# Patient Record
Sex: Female | Born: 1975 | Race: White | Hispanic: No | Marital: Married | Smoking: Current every day smoker
Health system: Southern US, Community
[De-identification: ages and names within clinical notes are randomized; demographics above are authoritative.]

## PROBLEM LIST (undated history)

## (undated) DIAGNOSIS — F319 Bipolar disorder, unspecified: Secondary | ICD-10-CM

## (undated) DIAGNOSIS — R0600 Dyspnea, unspecified: Secondary | ICD-10-CM

## (undated) DIAGNOSIS — J45909 Unspecified asthma, uncomplicated: Secondary | ICD-10-CM

## (undated) DIAGNOSIS — K219 Gastro-esophageal reflux disease without esophagitis: Secondary | ICD-10-CM

## (undated) DIAGNOSIS — R519 Headache, unspecified: Secondary | ICD-10-CM

## (undated) DIAGNOSIS — J449 Chronic obstructive pulmonary disease, unspecified: Secondary | ICD-10-CM

## (undated) DIAGNOSIS — R569 Unspecified convulsions: Secondary | ICD-10-CM

## (undated) DIAGNOSIS — R51 Headache: Secondary | ICD-10-CM

## (undated) DIAGNOSIS — F419 Anxiety disorder, unspecified: Secondary | ICD-10-CM

## (undated) DIAGNOSIS — F329 Major depressive disorder, single episode, unspecified: Secondary | ICD-10-CM

## (undated) DIAGNOSIS — F32A Depression, unspecified: Secondary | ICD-10-CM

## (undated) HISTORY — PX: APPENDECTOMY: SHX54

## (undated) HISTORY — PX: TUBAL LIGATION: SHX77

---

## 2006-05-16 DIAGNOSIS — R569 Unspecified convulsions: Secondary | ICD-10-CM

## 2006-05-16 HISTORY — DX: Unspecified convulsions: R56.9

## 2010-12-06 ENCOUNTER — Emergency Department: Payer: Self-pay | Admitting: Emergency Medicine

## 2015-08-30 ENCOUNTER — Emergency Department: Payer: Medicaid Other

## 2015-08-30 ENCOUNTER — Encounter: Payer: Self-pay | Admitting: Emergency Medicine

## 2015-08-30 ENCOUNTER — Emergency Department
Admission: EM | Admit: 2015-08-30 | Discharge: 2015-08-30 | Disposition: A | Discharge status: Home / Self Care | Payer: Medicaid Other | Attending: Emergency Medicine | Admitting: Emergency Medicine

## 2015-08-30 DIAGNOSIS — F17210 Nicotine dependence, cigarettes, uncomplicated: Secondary | ICD-10-CM | POA: Diagnosis not present

## 2015-08-30 DIAGNOSIS — F329 Major depressive disorder, single episode, unspecified: Secondary | ICD-10-CM | POA: Diagnosis not present

## 2015-08-30 DIAGNOSIS — R1011 Right upper quadrant pain: Secondary | ICD-10-CM | POA: Diagnosis not present

## 2015-08-30 DIAGNOSIS — R109 Unspecified abdominal pain: Secondary | ICD-10-CM | POA: Diagnosis present

## 2015-08-30 HISTORY — DX: Major depressive disorder, single episode, unspecified: F32.9

## 2015-08-30 HISTORY — DX: Depression, unspecified: F32.A

## 2015-08-30 HISTORY — DX: Anxiety disorder, unspecified: F41.9

## 2015-08-30 LAB — COMPREHENSIVE METABOLIC PANEL
ALK PHOS: 123 U/L (ref 38–126)
ALT: 14 U/L (ref 14–54)
ANION GAP: 6 (ref 5–15)
AST: 16 U/L (ref 15–41)
Albumin: 4.2 g/dL (ref 3.5–5.0)
BILIRUBIN TOTAL: 0.6 mg/dL (ref 0.3–1.2)
BUN: 11 mg/dL (ref 6–20)
CALCIUM: 8.8 mg/dL — AB (ref 8.9–10.3)
CO2: 23 mmol/L (ref 22–32)
CREATININE: 0.77 mg/dL (ref 0.44–1.00)
Chloride: 105 mmol/L (ref 101–111)
GFR calc non Af Amer: >60 mL/min (ref >60)
Glucose, Bld: 88 mg/dL (ref 65–99)
Potassium: 3.4 mmol/L — ABNORMAL LOW (ref 3.5–5.1)
Sodium: 134 mmol/L — ABNORMAL LOW (ref 135–145)
TOTAL PROTEIN: 7.8 g/dL (ref 6.5–8.1)

## 2015-08-30 LAB — URINALYSIS COMPLETE WITH MICROSCOPIC (ARMC ONLY)
BILIRUBIN URINE: NEGATIVE
Bacteria, UA: NONE SEEN
GLUCOSE, UA: NEGATIVE mg/dL
Hgb urine dipstick: NEGATIVE
KETONES UR: NEGATIVE mg/dL
NITRITE: NEGATIVE
PH: 5 (ref 5.0–8.0)
Protein, ur: NEGATIVE mg/dL
SPECIFIC GRAVITY, URINE: 1.018 (ref 1.005–1.030)

## 2015-08-30 LAB — CBC
HCT: 40.5 % (ref 35.0–47.0)
Hemoglobin: 13.3 g/dL (ref 12.0–16.0)
MCH: 26 pg (ref 26.0–34.0)
MCHC: 33 g/dL (ref 32.0–36.0)
MCV: 78.8 fL — AB (ref 80.0–100.0)
PLATELETS: 226 10*3/uL (ref 150–440)
RBC: 5.13 MIL/uL (ref 3.80–5.20)
RDW: 14 % (ref 11.5–14.5)
WBC: 9.7 10*3/uL (ref 3.6–11.0)

## 2015-08-30 LAB — LIPASE, BLOOD: Lipase: 18 U/L (ref 11–51)

## 2015-08-30 LAB — POCT PREGNANCY, URINE: Preg Test, Ur: NEGATIVE

## 2015-08-30 MED ORDER — KETOROLAC TROMETHAMINE 30 MG/ML IJ SOLN
30.0000 mg | Freq: Once | INTRAMUSCULAR | Status: AC
  Administered 2015-08-30: 30 mg via INTRAVENOUS

## 2015-08-30 MED ORDER — SODIUM CHLORIDE 0.9 % IV BOLUS (SEPSIS)
1000.0000 mL | Freq: Once | INTRAVENOUS | Status: AC
  Administered 2015-08-30: 1000 mL via INTRAVENOUS

## 2015-08-30 MED ORDER — ONDANSETRON HCL 4 MG/2ML IJ SOLN
INTRAMUSCULAR | Status: AC
  Administered 2015-08-30: 4 mg via INTRAVENOUS
  Filled 2015-08-30: qty 2

## 2015-08-30 MED ORDER — OXYCODONE-ACETAMINOPHEN 5-325 MG PO TABS: 1.0000 | ORAL_TABLET | Freq: Four times a day (QID) | ORAL | Status: DC | PRN

## 2015-08-30 MED ORDER — ONDANSETRON HCL 4 MG/2ML IJ SOLN
4.0000 mg | Freq: Once | INTRAMUSCULAR | Status: AC
  Administered 2015-08-30: 4 mg via INTRAVENOUS

## 2015-08-30 MED ORDER — KETOROLAC TROMETHAMINE 30 MG/ML IJ SOLN
INTRAMUSCULAR | Status: AC
  Filled 2015-08-30: qty 1

## 2015-08-30 MED ORDER — HYDROMORPHONE HCL 1 MG/ML IJ SOLN
1.0000 mg | Freq: Once | INTRAMUSCULAR | Status: AC
Start: 2015-08-30 — End: 2015-08-30
  Administered 2015-08-30: 1 mg via INTRAVENOUS

## 2015-08-30 MED ORDER — MORPHINE SULFATE (PF) 4 MG/ML IV SOLN
4.0000 mg | Freq: Once | INTRAVENOUS | Status: AC
  Administered 2015-08-30: 4 mg via INTRAVENOUS
  Filled 2015-08-30: qty 1

## 2015-08-30 MED ORDER — HYDROMORPHONE HCL 1 MG/ML IJ SOLN
INTRAMUSCULAR | Status: AC
Start: 2015-08-30 — End: 2015-08-30
  Administered 2015-08-30: 1 mg via INTRAVENOUS
  Filled 2015-08-30: qty 1

## 2015-08-30 NOTE — Discharge Instructions (Signed)
You have been seen in the emergency department today for abdominal pain. As we discussed your workup as shown normal results. If your pain worsens, you developed a fever, or any other symptom personally concerning to yourself please return to the emergency department for further evaluation, otherwise please follow-up with your primary care physician in one to 2 days for recheck/reevaluation.   Flank Pain Flank pain refers to pain that is located on the side of the body between the upper abdomen and the back. The pain may occur over a short period of time (acute) or may be long-term or reoccurring (chronic). It may be mild or severe. Flank pain can be caused by many things. CAUSES  Some of the more common causes of flank pain include:  Muscle strains.   Muscle spasms.   A disease of your spine (vertebral disk disease).   A lung infection (pneumonia).   Fluid around your lungs (pulmonary edema).   A kidney infection.   Kidney stones.   A very painful skin rash caused by the chickenpox virus (shingles).   Gallbladder disease.  HOME CARE INSTRUCTIONS  Home care will depend on the cause of your pain. In general,  Rest as directed by your caregiver.  Drink enough fluids to keep your urine clear or pale yellow.  Only take over-the-counter or prescription medicines as directed by your caregiver. Some medicines may help relieve the pain.  Tell your caregiver about any changes in your pain.  Follow up with your caregiver as directed. SEEK IMMEDIATE MEDICAL CARE IF:   Your pain is not controlled with medicine.   You have new or worsening symptoms.  Your pain increases.   You have abdominal pain.   You have shortness of breath.   You have persistent nausea or vomiting.   You have swelling in your abdomen.   You feel faint or pass out.   You have blood in your urine.  You have a fever or persistent symptoms for more than 2-3 days.  You have a fever and  your symptoms suddenly get worse. MAKE SURE YOU:   Understand these instructions.  Will watch your condition.  Will get help right away if you are not doing well or get worse.   This information is not intended to replace advice given to you by your health care provider. Make sure you discuss any questions you have with your health care provider.   Document Released: 06/23/2005 Document Revised: 01/25/2012 Document Reviewed: 12/15/2011 Elsevier Interactive Patient Education [employer]

## 2015-08-30 NOTE — ED Notes (Signed)
Pt up to bathroom.

## 2015-08-30 NOTE — ED Notes (Signed)
Pt alert and oriented X4, active, cooperative, pt in NAD. RR even and unlabored, color WNL.  Pt informed to return if any life threatening symptoms occur.   

## 2015-08-30 NOTE — ED Notes (Signed)
Started last night, worse tonight. No pain with urination. No hx of kidney stones

## 2015-08-30 NOTE — ED Provider Notes (Signed)
[facility] Emergency Department Provider Note  Time seen: 9:08 PM  I have reviewed the triage vital signs and the nursing notes.   HISTORY  Chief Complaint Flank Pain    HPI Carrie Marshall is a 40 y.o. female with a past medical history of anxiety/depression, presents to the emergency department with right sided abdominal pain. According to the patient since last night she has been experiencing severe 10/10 sharp pain to her right flank. Denies any dysuria, hematuria, history of kidney stones. Denies any worsening of pain with food intake. States nausea but denies vomiting. Denies diarrhea or constipation.     Past Medical History  Diagnosis Date  . Depression   . Anxiety     There are no active problems to display for this patient.   Past Surgical History  Procedure Laterality Date  . Appendectomy      No current outpatient prescriptions on file.  Allergies Review of patient's allergies indicates no known allergies.  History reviewed. No pertinent family history.  Social History Social History  Substance Use Topics  . Smoking status: Current Every Day Smoker -- 1.00 packs/day    Types: Cigarettes  . Smokeless tobacco: None  . Alcohol Use: No    Review of Systems Constitutional: Negative for fever Cardiovascular: Negative for chest pain. Respiratory: Negative for shortness of breath. Gastrointestinal: Right flank pain. Positive for nausea. Negative for vomiting. Genitourinary: Negative for dysuria. negative for hematuria  Musculoskeletal: Negative for back pain. Skin: Negative for rash. Neurological: Negative for headache 10-point ROS otherwise negative.  ____________________________________________   PHYSICAL EXAM:  VITAL SIGNS: ED Triage Vitals  Enc Vitals Group     BP 08/30/15 2036 108/77 mmHg     Pulse Rate 08/30/15 2036 72     Resp 08/30/15 2036 16     Temp 08/30/15 2036 98.4 F (36.9 C)     Temp Source 08/30/15  2036 Oral     SpO2 08/30/15 2036 96 %     Weight 08/30/15 2036 177 lb (80.287 kg)     Height 08/30/15 2036 5\' 7"  (1.702 m)     Head Cir --      Peak Flow --      Pain Score 08/30/15 2037 10     Pain Loc --      Pain Edu? --      Excl. in GC? --     Constitutional: Alert and oriented. Well appearing and in no distress. Eyes: Normal exam ENT   Head: Normocephalic and atraumatic.   Mouth/Throat: Mucous membranes are moist. Cardiovascular: Normal rate, regular rhythm.  Respiratory: Normal respiratory effort without tachypnea nor retractions. Breath sounds are clear  Gastrointestinal: soft, moderate tenderness to palpation in the right mid abdomen and right upper quadrant. No right lower quadrant tenderness to palpation. No rebound or guarding. No distention. Musculoskeletal: Nontender with normal range of motion in all extremities.  Neurologic:  Normal speech and language. No gross focal neurologic deficits  Skin:  Skin is warm, dry and intact.  Psychiatric: Mood and affect are normal. Speech and behavior are normal.  ____________________________________________     RADIOLOGY  CT negative Ultrasound negative  ____________________________________________    INITIAL IMPRESSION / ASSESSMENT AND PLAN / ED COURSE  Pertinent labs & imaging results that were available during my care of the patient were reviewed by me and considered in my medical decision making (see chart for details).  Patient presents with right flank pain since last night. Patient does have  moderate tenderness to palpation in the right mid abdomen and right upper quadrant. We'll proceed with labs, right upper quadrant ultrasound, treat pain and nausea, and IV hydrate.  Labs are within normal limits. CT no center negative. It is not clear at this time what is causing the patient's discomfort. She states it is improved but still present. I discussed options with the patient, she would prefer to be discharged  home, will provide a short course of pain medication and the patient is to return to the emergency department if the pain worsens, otherwise she'll follow-up with her doctor.  ____________________________________________   FINAL CLINICAL IMPRESSION(S) / ED DIAGNOSES  Right flank pain   Minna Antis, MD 08/30/15 2310

## 2015-10-10 ENCOUNTER — Encounter: Payer: Self-pay | Admitting: Emergency Medicine

## 2015-10-10 ENCOUNTER — Emergency Department: Payer: Medicaid Other

## 2015-10-10 DIAGNOSIS — F329 Major depressive disorder, single episode, unspecified: Secondary | ICD-10-CM | POA: Insufficient documentation

## 2015-10-10 DIAGNOSIS — B349 Viral infection, unspecified: Secondary | ICD-10-CM | POA: Diagnosis not present

## 2015-10-10 DIAGNOSIS — F17210 Nicotine dependence, cigarettes, uncomplicated: Secondary | ICD-10-CM | POA: Insufficient documentation

## 2015-10-10 DIAGNOSIS — R0789 Other chest pain: Secondary | ICD-10-CM | POA: Insufficient documentation

## 2015-10-10 LAB — BASIC METABOLIC PANEL
ANION GAP: 8 (ref 5–15)
BUN: 15 mg/dL (ref 6–20)
CO2: 21 mmol/L — AB (ref 22–32)
CREATININE: 0.78 mg/dL (ref 0.44–1.00)
Calcium: 9.4 mg/dL (ref 8.9–10.3)
Chloride: 108 mmol/L (ref 101–111)
GFR calc non Af Amer: >60 mL/min (ref >60)
Glucose, Bld: 92 mg/dL (ref 65–99)
POTASSIUM: 3.8 mmol/L (ref 3.5–5.1)
Sodium: 137 mmol/L (ref 135–145)

## 2015-10-10 LAB — CBC
HEMATOCRIT: 40.9 % (ref 35.0–47.0)
HEMOGLOBIN: 13.7 g/dL (ref 12.0–16.0)
MCH: 26.1 pg (ref 26.0–34.0)
MCHC: 33.4 g/dL (ref 32.0–36.0)
MCV: 78 fL — AB (ref 80.0–100.0)
PLATELETS: 247 10*3/uL (ref 150–440)
RBC: 5.24 MIL/uL — ABNORMAL HIGH (ref 3.80–5.20)
RDW: 13.4 % (ref 11.5–14.5)
WBC: 9.5 10*3/uL (ref 3.6–11.0)

## 2015-10-10 LAB — TROPONIN I: Troponin I: <0.03 ng/mL (ref <0.031)

## 2015-10-10 LAB — POCT PREGNANCY, URINE: Preg Test, Ur: NEGATIVE

## 2015-10-10 NOTE — ED Notes (Signed)
Cough x 1 week - now states cp and sob. vss

## 2015-10-11 ENCOUNTER — Emergency Department
Admission: EM | Admit: 2015-10-11 | Discharge: 2015-10-11 | Disposition: A | Discharge status: Home / Self Care | Payer: Medicaid Other | Attending: Emergency Medicine | Admitting: Emergency Medicine

## 2015-10-11 DIAGNOSIS — R0789 Other chest pain: Secondary | ICD-10-CM

## 2015-10-11 DIAGNOSIS — B349 Viral infection, unspecified: Secondary | ICD-10-CM

## 2015-10-11 MED ORDER — BENZONATATE 100 MG PO CAPS: 100.0000 mg | ORAL_CAPSULE | Freq: Three times a day (TID) | ORAL | Status: DC | PRN

## 2015-10-11 MED ORDER — KETOROLAC TROMETHAMINE 60 MG/2ML IM SOLN: 15.0000 mg | Freq: Once | INTRAMUSCULAR | Status: DC

## 2015-10-11 MED ORDER — KETOROLAC TROMETHAMINE 30 MG/ML IJ SOLN
15.0000 mg | Freq: Once | INTRAMUSCULAR | Status: DC
  Administered 2015-10-11: 15 mg via INTRAVENOUS
  Filled 2015-10-11: qty 1

## 2015-10-11 NOTE — ED Notes (Signed)
Pt up to desk to check on wait time; understands waiting on treatment room; was going to leave but at this time pt has been convinced to stay for treatment room;

## 2015-10-11 NOTE — Discharge Instructions (Signed)
You have been seen in the Emergency Department (ED) today for a likely viral illness.  Please drink plenty of clear fluids (water, Gatorade, chicken broth, etc).  You may use Tylenol and/or Motrin according to label instructions.  You can alternate between the two without any side effects.   Please follow up with your doctor as listed above.  Call your doctor or return to the Emergency Department (ED) if you are unable to tolerate fluids due to vomiting, have worsening trouble breathing, become extremely tired or difficult to awaken, or if you develop any other symptoms that concern you.   Viral Infections A viral infection can be caused by different types of viruses.Most viral infections are not serious and resolve on their own. However, some infections may cause severe symptoms and may lead to further complications. SYMPTOMS Viruses can frequently cause:  Minor sore throat.  Aches and pains.  Headaches.  Runny nose.  Different types of rashes.  Watery eyes.  Tiredness.  Cough.  Loss of appetite.  Gastrointestinal infections, resulting in nausea, vomiting, and diarrhea. These symptoms do not respond to antibiotics because the infection is not caused by bacteria. However, you might catch a bacterial infection following the viral infection. This is sometimes called a "superinfection." Symptoms of such a bacterial infection may include:  Worsening sore throat with pus and difficulty swallowing.  Swollen neck glands.  Chills and a high or persistent fever.  Severe headache.  Tenderness over the sinuses.  Persistent overall ill feeling (malaise), muscle aches, and tiredness (fatigue).  Persistent cough.  Yellow, green, or brown mucus production with coughing. HOME CARE INSTRUCTIONS   Only take over-the-counter or prescription medicines for pain, discomfort, diarrhea, or fever as directed by your caregiver.  Drink enough water and fluids to keep your urine clear or  pale yellow. Sports drinks can provide valuable electrolytes, sugars, and hydration.  Get plenty of rest and maintain proper nutrition. Soups and broths with crackers or rice are fine. SEEK IMMEDIATE MEDICAL CARE IF:   You have severe headaches, shortness of breath, chest pain, neck pain, or an unusual rash.  You have uncontrolled vomiting, diarrhea, or you are unable to keep down fluids.  You or your child has an oral temperature above 102 F (38.9 C), not controlled by medicine.  Your baby is older than 3 months with a rectal temperature of 102 F (38.9 C) or higher.  Your baby is 483 months old or younger with a rectal temperature of 100.4 F (38 C) or higher. MAKE SURE YOU:   Understand these instructions.  Will watch your condition.  Will get help right away if you are not doing well or get worse.   This information is not intended to replace advice given to you by your health care provider. Make sure you discuss any questions you have with your health care provider.   Document Released: 02/09/2005 Document Revised: 07/25/2011 Document Reviewed: 10/08/2014 Elsevier Interactive Patient Education 2016 Elsevier Inc.  Chest Wall Pain Chest wall pain is pain in or around the bones and muscles of your chest. Sometimes, an injury causes this pain. Sometimes, the cause may not be known. This pain may take several weeks or longer to get better. HOME CARE INSTRUCTIONS  Pay attention to any changes in your symptoms. Take these actions to help with your pain:   Rest as told by your health care provider.   Avoid activities that cause pain. These include any activities that use your chest muscles or your  abdominal and side muscles to lift heavy items.   If directed, apply ice to the painful area:  Put ice in a plastic bag.  Place a towel between your skin and the bag.  Leave the ice on for 20 minutes, 2-3 times per day.  Take over-the-counter and prescription medicines only as  told by your health care provider.  Do not use tobacco products, including cigarettes, chewing tobacco, and e-cigarettes. If you need help quitting, ask your health care provider.  Keep all follow-up visits as told by your health care provider. This is important. SEEK MEDICAL CARE IF:  You have a fever.  Your chest pain becomes worse.  You have new symptoms. SEEK IMMEDIATE MEDICAL CARE IF:  You have nausea or vomiting.  You feel sweaty or light-headed.  You have a cough with phlegm (sputum) or you cough up blood.  You develop shortness of breath.   This information is not intended to replace advice given to you by your health care provider. Make sure you discuss any questions you have with your health care provider.   Document Released: 05/02/2005 Document Revised: 01/21/2015 Document Reviewed: 07/28/2014 Elsevier Interactive Patient Education [employer]

## 2015-10-11 NOTE — ED Provider Notes (Signed)
[facility] Emergency Department Provider Note  ____________________________________________  Time seen: Approximately 2:28 AM  I have reviewed the triage vital signs and the nursing notes.   HISTORY  Chief Complaint Cough and Chest Pain    HPI Carrie Marshall is a 40 y.o. female with past medical history that includes depression, anxiety, and tobacco use who presents for approximately 1 week of gradual onset upper respiratory infectious symptoms that include a mild, slightly productive cough, nasal congestion, runny nose, low-grade fevers.  She states that as of a few hours ago she developed chest wall tenderness all throughout her anterior chest after she has been coughing severely for about 24 hours, and she describes the chest wall tenderness as severe, sharp, and worse with movement and deep breaths.  She denies chills, nausea/vomiting, abdominal pain, diarrhea, dysuria.  She describes her symptoms as severe overall.   Past Medical History  Diagnosis Date  . Depression   . Anxiety     There are no active problems to display for this patient.   Past Surgical History  Procedure Laterality Date  . Appendectomy      Current Outpatient Rx  Name  Route  Sig  Dispense  Refill  . benzonatate (TESSALON PERLES) 100 MG capsule   Oral   Take 1 capsule (100 mg total) by mouth 3 (three) times daily as needed for cough.   30 capsule   0   . oxyCODONE-acetaminophen (ROXICET) 5-325 MG tablet   Oral   Take 1 tablet by mouth every 6 (six) hours as needed.   12 tablet   0     Allergies Review of patient's allergies indicates no known allergies.  History reviewed. No pertinent family history.  Social History Social History  Substance Use Topics  . Smoking status: Current Every Day Smoker -- 1.00 packs/day    Types: Cigarettes  . Smokeless tobacco: None  . Alcohol Use: No    Review of Systems Constitutional: Subjective fever Eyes: No visual  changes. ENT: No sore throat. Cardiovascular: Chest wall tenderness Respiratory: Mild SOB and cough x 1 week Gastrointestinal: No abdominal pain.  No nausea, no vomiting.  No diarrhea.  No constipation. Genitourinary: Negative for dysuria. Musculoskeletal: Negative for back pain. Skin: Negative for rash. Neurological: Negative for headaches, focal weakness or numbness.  10-point ROS otherwise negative.  ____________________________________________   PHYSICAL EXAM:  VITAL SIGNS: ED Triage Vitals  Enc Vitals Group     BP 10/10/15 2150 123/110 mmHg     Pulse Rate 10/10/15 2150 78     Resp 10/10/15 2150 18     Temp 10/10/15 2150 98.1 F (36.7 C)     Temp src --      SpO2 10/10/15 2150 99 %     Weight 10/10/15 2150 182 lb (82.555 kg)     Height 10/10/15 2150 5\' 7"  (1.702 m)     Head Cir --      Peak Flow --      Pain Score 10/10/15 2151 10     Pain Loc --      Pain Edu? --      Excl. in GC? --     Constitutional: Alert and oriented. Generally well appearing but appears uncomfortable Eyes: Conjunctivae are normal. PERRL. EOMI. Head: Atraumatic. Nose: No congestion/rhinnorhea. Mouth/Throat: Mucous membranes are moist.  Oropharynx non-erythematous. Neck: No stridor.  No meningeal signs.   Cardiovascular: Normal rate, regular rhythm. Good peripheral circulation. Grossly normal heart sounds.  Highly  reproducible chest wall tenderness to palpation all throughout the chest wall. Respiratory: Normal respiratory effort.  No retractions. Lungs CTAB. Gastrointestinal: Soft and nontender. No distention.  Musculoskeletal: No lower extremity tenderness nor edema. No gross deformities of extremities. Neurologic:  Normal speech and language. No gross focal neurologic deficits are appreciated.  Skin:  Skin is warm, dry and intact. No rash noted. Psychiatric: Mood and affect are normal. Speech and behavior are normal.  ____________________________________________   LABS (all labs  ordered are listed, but only abnormal results are displayed)  Labs Reviewed  BASIC METABOLIC PANEL - Abnormal; Notable for the following:    CO2 21 (*)    All other components within normal limits  CBC - Abnormal; Notable for the following:    RBC 5.24 (*)    MCV 78.0 (*)    All other components within normal limits  TROPONIN I  POC URINE PREG, ED  POCT PREGNANCY, URINE   ____________________________________________  EKG  ED ECG REPORT I, Mervil Wacker, the attending physician, personally viewed and interpreted this ECG.  Date: 10/11/2015 EKG Time: 22:03 Rate: 78 Rhythm: normal sinus rhythm QRS Axis: normal Intervals: normal ST/T Wave abnormalities: Non-specific ST segment / T-wave changes, but no evidence of acute ischemia. Conduction Disturbances: none Narrative Interpretation: unremarkable  ____________________________________________  RADIOLOGY   Dg Chest 2 View  10/10/2015  CLINICAL DATA:  Acute onset of cough, generalized chest pain and shortness of breath. Initial encounter. EXAM: CHEST  2 VIEW COMPARISON:  None. FINDINGS: The lungs are well-aerated and clear. There is no evidence of focal opacification, pleural effusion or pneumothorax. The heart is normal in size; the mediastinal contour is within normal limits. No acute osseous abnormalities are seen. IMPRESSION: No acute cardiopulmonary process seen. Electronically Signed   By: Roanna Raider M.D.   On: 10/10/2015 23:11    ____________________________________________   PROCEDURES  Procedure(s) performed: None  Critical Care performed: No ____________________________________________   INITIAL IMPRESSION / ASSESSMENT AND PLAN / ED COURSE  Pertinent labs & imaging results that were available during my care of the patient were reviewed by me and considered in my medical decision making (see chart for details).  HEART score 1 (tobacco use), PERC negative.  I suspect she has musculoskeletal chest wall  tenderness due to her frequent cough.  Symptoms are most consistent with a viral syndrome.  Prescription as listed below, Toradol 15 mg IM prior to discharge.  She is comfortable with the plan for outpatient follow-up.   ____________________________________________  FINAL CLINICAL IMPRESSION(S) / ED DIAGNOSES  Final diagnoses:  Viral syndrome  Chest wall pain     MEDICATIONS GIVEN DURING THIS VISIT:  Medications  ketorolac (TORADOL) 30 MG/ML injection 15 mg (not administered)     NEW OUTPATIENT MEDICATIONS STARTED DURING THIS VISIT:  New Prescriptions   BENZONATATE (TESSALON PERLES) 100 MG CAPSULE    Take 1 capsule (100 mg total) by mouth 3 (three) times daily as needed for cough.      Note:  This document was prepared using Dragon voice recognition software and may include unintentional dictation errors.   Loleta Rose, MD 10/11/15 [phone]

## 2016-02-29 ENCOUNTER — Emergency Department
Admission: EM | Admit: 2016-02-29 | Discharge: 2016-02-29 | Disposition: A | Discharge status: Home / Self Care | Payer: Medicaid Other | Attending: Emergency Medicine | Admitting: Emergency Medicine

## 2016-02-29 ENCOUNTER — Encounter: Payer: Self-pay | Admitting: Emergency Medicine

## 2016-02-29 ENCOUNTER — Emergency Department: Payer: Medicaid Other

## 2016-02-29 DIAGNOSIS — N83209 Unspecified ovarian cyst, unspecified side: Secondary | ICD-10-CM

## 2016-02-29 DIAGNOSIS — R102 Pelvic and perineal pain unspecified side: Secondary | ICD-10-CM

## 2016-02-29 DIAGNOSIS — N739 Female pelvic inflammatory disease, unspecified: Secondary | ICD-10-CM | POA: Insufficient documentation

## 2016-02-29 DIAGNOSIS — A599 Trichomoniasis, unspecified: Secondary | ICD-10-CM

## 2016-02-29 DIAGNOSIS — F17210 Nicotine dependence, cigarettes, uncomplicated: Secondary | ICD-10-CM | POA: Insufficient documentation

## 2016-02-29 DIAGNOSIS — R1032 Left lower quadrant pain: Secondary | ICD-10-CM

## 2016-02-29 DIAGNOSIS — N730 Acute parametritis and pelvic cellulitis: Secondary | ICD-10-CM

## 2016-02-29 LAB — CBC WITH DIFFERENTIAL/PLATELET
Basophils Absolute: 0.1 10*3/uL (ref 0–0.1)
Basophils Relative: 1 %
Eosinophils Absolute: 0 10*3/uL (ref 0–0.7)
Eosinophils Relative: 1 %
HCT: 42.5 % (ref 35.0–47.0)
HEMOGLOBIN: 14.2 g/dL (ref 12.0–16.0)
LYMPHS ABS: 2.7 10*3/uL (ref 1.0–3.6)
Lymphocytes Relative: 25 %
MCH: 26.3 pg (ref 26.0–34.0)
MCHC: 33.3 g/dL (ref 32.0–36.0)
MCV: 79 fL — AB (ref 80.0–100.0)
Monocytes Absolute: 0.7 10*3/uL (ref 0.2–0.9)
Monocytes Relative: 6 %
Neutro Abs: 7.2 10*3/uL — ABNORMAL HIGH (ref 1.4–6.5)
Neutrophils Relative %: 67 %
Platelets: 223 10*3/uL (ref 150–440)
RBC: 5.38 MIL/uL — AB (ref 3.80–5.20)
RDW: 14.9 % — ABNORMAL HIGH (ref 11.5–14.5)
WBC: 10.6 10*3/uL (ref 3.6–11.0)

## 2016-02-29 LAB — COMPREHENSIVE METABOLIC PANEL
ALK PHOS: 106 U/L (ref 38–126)
ALT: 15 U/L (ref 14–54)
AST: 17 U/L (ref 15–41)
Albumin: 4.2 g/dL (ref 3.5–5.0)
Anion gap: 7 (ref 5–15)
BUN: 11 mg/dL (ref 6–20)
CALCIUM: 8.9 mg/dL (ref 8.9–10.3)
CO2: 24 mmol/L (ref 22–32)
CREATININE: 0.72 mg/dL (ref 0.44–1.00)
Chloride: 105 mmol/L (ref 101–111)
Glucose, Bld: 94 mg/dL (ref 65–99)
Potassium: 3.3 mmol/L — ABNORMAL LOW (ref 3.5–5.1)
Sodium: 136 mmol/L (ref 135–145)
Total Bilirubin: 1 mg/dL (ref 0.3–1.2)
Total Protein: 7.8 g/dL (ref 6.5–8.1)

## 2016-02-29 LAB — URINALYSIS COMPLETE WITH MICROSCOPIC (ARMC ONLY)
Bacteria, UA: NONE SEEN
Bilirubin Urine: NEGATIVE
Glucose, UA: NEGATIVE mg/dL
KETONES UR: NEGATIVE mg/dL
Nitrite: NEGATIVE
PROTEIN: NEGATIVE mg/dL
Specific Gravity, Urine: 1.011 (ref 1.005–1.030)
pH: 5 (ref 5.0–8.0)

## 2016-02-29 LAB — CHLAMYDIA/NGC RT PCR (ARMC ONLY)
CHLAMYDIA TR: NOT DETECTED
N gonorrhoeae: NOT DETECTED

## 2016-02-29 LAB — WET PREP, GENITAL
Clue Cells Wet Prep HPF POC: NONE SEEN
Sperm: NONE SEEN
Yeast Wet Prep HPF POC: NONE SEEN

## 2016-02-29 LAB — LIPASE, BLOOD: LIPASE: 20 U/L (ref 11–51)

## 2016-02-29 LAB — HCG, QUANTITATIVE, PREGNANCY

## 2016-02-29 LAB — POC URINE PREG, ED: Preg Test, Ur: NEGATIVE

## 2016-02-29 MED ORDER — HYDROMORPHONE HCL 1 MG/ML IJ SOLN
1.0000 mg | Freq: Once | INTRAMUSCULAR | Status: AC
Start: 2016-02-29 — End: 2016-02-29
  Administered 2016-02-29: 1 mg via INTRAVENOUS
  Filled 2016-02-29: qty 1

## 2016-02-29 MED ORDER — MORPHINE SULFATE (PF) 4 MG/ML IV SOLN
4.0000 mg | Freq: Once | INTRAVENOUS | Status: AC
  Administered 2016-02-29: 4 mg via INTRAVENOUS
  Filled 2016-02-29: qty 1

## 2016-02-29 MED ORDER — ONDANSETRON HCL 4 MG/2ML IJ SOLN
4.0000 mg | Freq: Once | INTRAMUSCULAR | Status: AC
  Administered 2016-02-29: 4 mg via INTRAVENOUS

## 2016-02-29 MED ORDER — DOXYCYCLINE HYCLATE 100 MG PO CAPS: 100.0000 mg | ORAL_CAPSULE | Freq: Two times a day (BID) | ORAL | 0 refills | Status: DC

## 2016-02-29 MED ORDER — METRONIDAZOLE 500 MG PO TABS: 500.0000 mg | ORAL_TABLET | Freq: Two times a day (BID) | ORAL | 0 refills | Status: DC

## 2016-02-29 MED ORDER — MORPHINE SULFATE (PF) 4 MG/ML IV SOLN
4.0000 mg | Freq: Once | INTRAVENOUS | Status: AC
  Administered 2016-02-29: 4 mg via INTRAVENOUS

## 2016-02-29 MED ORDER — CEFTRIAXONE SODIUM 250 MG IJ SOLR
INTRAMUSCULAR | Status: AC
  Administered 2016-02-29: 250 mg via INTRAMUSCULAR
  Filled 2016-02-29: qty 250

## 2016-02-29 MED ORDER — CEFTRIAXONE SODIUM 250 MG IJ SOLR
250.0000 mg | Freq: Once | INTRAMUSCULAR | Status: AC
  Administered 2016-02-29: 250 mg via INTRAMUSCULAR

## 2016-02-29 MED ORDER — AZITHROMYCIN 250 MG PO TABS
1000.0000 mg | ORAL_TABLET | Freq: Once | ORAL | Status: AC
  Administered 2016-02-29: 1000 mg via ORAL

## 2016-02-29 MED ORDER — AZITHROMYCIN 500 MG PO TABS
ORAL_TABLET | ORAL | Status: AC
  Administered 2016-02-29: 1000 mg via ORAL
  Filled 2016-02-29: qty 2

## 2016-02-29 MED ORDER — ONDANSETRON HCL 4 MG PO TABS: 4.0000 mg | ORAL_TABLET | Freq: Three times a day (TID) | ORAL | 0 refills | Status: DC | PRN

## 2016-02-29 MED ORDER — ONDANSETRON HCL 4 MG/2ML IJ SOLN
INTRAMUSCULAR | Status: AC
  Filled 2016-02-29: qty 2

## 2016-02-29 MED ORDER — MORPHINE SULFATE (PF) 4 MG/ML IV SOLN
INTRAVENOUS | Status: AC
  Administered 2016-02-29: 4 mg via INTRAVENOUS
  Filled 2016-02-29: qty 1

## 2016-02-29 MED ORDER — SODIUM CHLORIDE 0.9 % IV BOLUS (SEPSIS)
1000.0000 mL | Freq: Once | INTRAVENOUS | Status: AC
  Administered 2016-02-29: 1000 mL via INTRAVENOUS

## 2016-02-29 MED ORDER — OXYCODONE-ACETAMINOPHEN 5-325 MG PO TABS: 1.0000 | ORAL_TABLET | Freq: Four times a day (QID) | ORAL | 0 refills | Status: DC | PRN

## 2016-02-29 NOTE — ED Notes (Signed)
Pt discharged to home.  Family member driving.  Discharge instructions reviewed.  Verbalized understanding.  No questions or concerns at this time.  Teach back verified.  Pt in NAD.  No items left in ED.   

## 2016-02-29 NOTE — ED Provider Notes (Addendum)
[facility] Emergency Department Provider Note  ____________________________________________   I have reviewed the triage vital signs and the nursing notes.   HISTORY  Chief Complaint Abdominal Pain    HPI Carrie Marshall is a 40 y.o. female presents today complaining of abdominal pain mostly in the lower abdomen as well as vomiting. Did have a slight loose stools well. Denies fever or chills. The nausea has been there since last Monday and the pain is been there since Friday. She has a slight vaginal discharge. She denies any fever or chills. Pain is more on the left than on the right. Does have a history of ovarian cyst. Describes the pain as sharp. Nonradiating.She also states she began to have some vaginal spotting today which is unusual for her. She normally has her period every month, except for there was a period of time in which she was very irregular but that has not been for 5 years. When she began to have some irregular vaginal bleeding, which is 2 weeks away from her last period, she elected to come for further evaluation.      Past Medical History:  Diagnosis Date  . Anxiety   . Depression     There are no active problems to display for this patient.   Past Surgical History:  Procedure Laterality Date  . APPENDECTOMY      Prior to Admission medications   Medication Sig Start Date End Date Taking? Authorizing Provider  benzonatate (TESSALON PERLES) 100 MG capsule Take 1 capsule (100 mg total) by mouth 3 (three) times daily as needed for cough. 10/11/15   Hinda Kehr, MD  oxyCODONE-acetaminophen (ROXICET) 5-325 MG tablet Take 1 tablet by mouth every 6 (six) hours as needed. 08/30/15   Harvest Dark, MD    Allergies Review of patient's allergies indicates no known allergies.  History reviewed. No pertinent family history.  Social History Social History  Substance Use Topics  . Smoking status: Current Every Day Smoker    Packs/day:  1.00    Types: Cigarettes  . Smokeless tobacco: Never Used  . Alcohol use No    Review of Systems Constitutional: No fever/chills Eyes: No visual changes. ENT: No sore throat. No stiff neck no neck pain Cardiovascular: Denies chest pain. Respiratory: Denies shortness of breath. Gastrointestinal:   See history of present illness Genitourinary: Negative for dysuria. Musculoskeletal: Negative lower extremity swelling Skin: Negative for rash. Neurological: Negative for severe headaches, focal weakness or numbness. 10-point ROS otherwise negative.  ____________________________________________   PHYSICAL EXAM:  VITAL SIGNS: ED Triage Vitals  Enc Vitals Group     BP 02/29/16 1707 123/72     Pulse Rate 02/29/16 1707 79     Resp 02/29/16 1707 20     Temp 02/29/16 1707 97.7 F (36.5 C)     Temp Source 02/29/16 1707 Oral     SpO2 02/29/16 1707 100 %     Weight 02/29/16 1708 191 lb (86.6 kg)     Height 02/29/16 1708 5' 7"  (1.702 m)     Head Circumference --      Peak Flow --      Pain Score 02/29/16 1708 10     Pain Loc --      Pain Edu? --      Excl. in [place]? --     Constitutional: Alert and oriented. Well appearing and in no acute distress. Eyes: Conjunctivae are normal. PERRL. EOMI. Head: Atraumatic. Nose: No congestion/rhinnorhea. Mouth/Throat: Mucous membranes are moist.  Oropharynx non-erythematous. Neck: No stridor.   Nontender with no meningismus Cardiovascular: Normal rate, regular rhythm. Grossly normal heart sounds.  Good peripheral circulation. Respiratory: Normal respiratory effort.  No retractions. Lungs CTAB. Abdominal: Soft and Minimal tenderness to palpation of the left lower quadrant. No distention. No guarding no rebound Back:  There is no focal tenderness or step off.  there is no midline tenderness there are no lesions noted. there is no CVA tenderness Pelvic exam: Female nurse chaperone present, no external lesions noted, white vaginal discharge noted with  no purulent discharge, mild cervical motion tenderness, + L adnexal tenderness or mass, there is no significant uterine tenderness or mass. Scant mild vaginal bleeding Musculoskeletal: No lower extremity tenderness, no upper extremity tenderness. No joint effusions, no DVT signs strong distal pulses no edema Neurologic:  Normal speech and language. No gross focal neurologic deficits are appreciated.  Skin:  Skin is warm, dry and intact. No rash noted. Psychiatric: Mood and affect are normal. Speech and behavior are normal.  ____________________________________________   LABS (all labs ordered are listed, but only abnormal results are displayed)  Labs Reviewed  WET PREP, GENITAL - Abnormal; Notable for the following:       Result Value   Trich, Wet Prep PRESENT (*)    WBC, Wet Prep HPF POC MODERATE (*)    All other components within normal limits  CBC WITH DIFFERENTIAL/PLATELET - Abnormal; Notable for the following:    RBC 5.38 (*)    MCV 79.0 (*)    RDW 14.9 (*)    Neutro Abs 7.2 (*)    All other components within normal limits  COMPREHENSIVE METABOLIC PANEL - Abnormal; Notable for the following:    Potassium 3.3 (*)    All other components within normal limits  URINALYSIS COMPLETEWITH MICROSCOPIC ([facility] ONLY) - Abnormal; Notable for the following:    Color, Urine YELLOW (*)    APPearance CLEAR (*)    Hgb urine dipstick 2+ (*)    Leukocytes, UA 3+ (*)    Squamous Epithelial / LPF 0-5 (*)    All other components within normal limits  CHLAMYDIA/NGC RT PCR ([facility] ONLY)  URINE CULTURE  LIPASE, BLOOD  HCG, QUANTITATIVE, PREGNANCY  POC URINE PREG, ED   ____________________________________________  EKG  I personally interpreted any EKGs ordered by me or triage  ____________________________________________  RADIOLOGY  I reviewed any imaging ordered by me or triage that were performed during my shift and, if possible, patient and/or family made aware of any abnormal  findings. ____________________________________________   PROCEDURES  Procedure(s) performed: None  Procedures  Critical Care performed: None  ____________________________________________   INITIAL IMPRESSION / ASSESSMENT AND PLAN / ED COURSE  Pertinent labs & imaging results that were available during my care of the patient were reviewed by me and considered in my medical decision making (see chart for details).  Patient with history of ovarian cysts results with lower abdominal pain and some mild vaginal bleeding. I checked a pregnancy on her. It is negative. She had a BTL in the past. For this reason I also checked a Quant to ensure there is no chance of ectopic and that is negative as well. Patient does have positive Trichomonas. She certainly could've PID causing some of her symptoms but given that she has some degree of lateralizing signs, we'll obtain an ultrasound and reassess. Difficult to understand why she is having vaginal spotting this context. However, she has a nonsurgical abdomen and the rest of her blood  work is reassuring.  Clinical Course   ____________________________________________   FINAL CLINICAL IMPRESSION(S) / ED DIAGNOSES  Final diagnoses:  Pelvic pain      This chart was dictated using voice recognition software.  Despite best efforts to proofread,  errors can occur which can change meaning.       Schuyler Amor, MD 02/29/16 [place], MD 02/29/16 [phone]

## 2016-02-29 NOTE — ED Triage Notes (Signed)
Reports LLQ pain and vaginal bleeding onset this am.  Reports regular period 3 wks ago.

## 2016-03-02 LAB — URINE CULTURE

## 2016-03-16 ENCOUNTER — Emergency Department
Admission: EM | Admit: 2016-03-16 | Discharge: 2016-03-16 | Disposition: A | Discharge status: Home / Self Care | Payer: Medicaid Other | Attending: Emergency Medicine | Admitting: Emergency Medicine

## 2016-03-16 ENCOUNTER — Emergency Department: Payer: Medicaid Other

## 2016-03-16 ENCOUNTER — Encounter: Payer: Self-pay | Admitting: *Deleted

## 2016-03-16 DIAGNOSIS — F17210 Nicotine dependence, cigarettes, uncomplicated: Secondary | ICD-10-CM | POA: Diagnosis not present

## 2016-03-16 DIAGNOSIS — Z79899 Other long term (current) drug therapy: Secondary | ICD-10-CM | POA: Diagnosis not present

## 2016-03-16 DIAGNOSIS — R102 Pelvic and perineal pain: Secondary | ICD-10-CM

## 2016-03-16 DIAGNOSIS — R1031 Right lower quadrant pain: Secondary | ICD-10-CM | POA: Diagnosis present

## 2016-03-16 DIAGNOSIS — R112 Nausea with vomiting, unspecified: Secondary | ICD-10-CM | POA: Insufficient documentation

## 2016-03-16 DIAGNOSIS — N83209 Unspecified ovarian cyst, unspecified side: Secondary | ICD-10-CM | POA: Insufficient documentation

## 2016-03-16 LAB — URINALYSIS COMPLETE WITH MICROSCOPIC (ARMC ONLY)
BACTERIA UA: NONE SEEN
Bilirubin Urine: NEGATIVE
GLUCOSE, UA: NEGATIVE mg/dL
Hgb urine dipstick: NEGATIVE
Ketones, ur: NEGATIVE mg/dL
Leukocytes, UA: NEGATIVE
NITRITE: NEGATIVE
Protein, ur: NEGATIVE mg/dL
SPECIFIC GRAVITY, URINE: 1.014 (ref 1.005–1.030)
pH: 5 (ref 5.0–8.0)

## 2016-03-16 LAB — COMPREHENSIVE METABOLIC PANEL
ALBUMIN: 4 g/dL (ref 3.5–5.0)
ALK PHOS: 97 U/L (ref 38–126)
ALT: 21 U/L (ref 14–54)
AST: 25 U/L (ref 15–41)
Anion gap: 8 (ref 5–15)
BILIRUBIN TOTAL: 0.4 mg/dL (ref 0.3–1.2)
BUN: 9 mg/dL (ref 6–20)
CALCIUM: 9 mg/dL (ref 8.9–10.3)
CO2: 22 mmol/L (ref 22–32)
Chloride: 105 mmol/L (ref 101–111)
Creatinine, Ser: 0.68 mg/dL (ref 0.44–1.00)
GFR calc Af Amer: >60 mL/min (ref >60)
GFR calc non Af Amer: >60 mL/min (ref >60)
GLUCOSE: 116 mg/dL — AB (ref 65–99)
Potassium: 3.5 mmol/L (ref 3.5–5.1)
Sodium: 135 mmol/L (ref 135–145)
TOTAL PROTEIN: 7.7 g/dL (ref 6.5–8.1)

## 2016-03-16 LAB — CBC
HEMATOCRIT: 42.8 % (ref 35.0–47.0)
Hemoglobin: 14.4 g/dL (ref 12.0–16.0)
MCH: 26.3 pg (ref 26.0–34.0)
MCHC: 33.6 g/dL (ref 32.0–36.0)
MCV: 78.3 fL — ABNORMAL LOW (ref 80.0–100.0)
Platelets: 256 10*3/uL (ref 150–440)
RBC: 5.46 MIL/uL — ABNORMAL HIGH (ref 3.80–5.20)
RDW: 14.6 % — AB (ref 11.5–14.5)
WBC: 9.8 10*3/uL (ref 3.6–11.0)

## 2016-03-16 LAB — LIPASE, BLOOD: Lipase: 20 U/L (ref 11–51)

## 2016-03-16 LAB — POCT PREGNANCY, URINE: PREG TEST UR: NEGATIVE

## 2016-03-16 MED ORDER — PROMETHAZINE HCL 25 MG/ML IJ SOLN
INTRAMUSCULAR | Status: AC
  Administered 2016-03-16: 25 mg via INTRAMUSCULAR
  Filled 2016-03-16: qty 1

## 2016-03-16 MED ORDER — PROMETHAZINE HCL 25 MG PO TABS: 25.0000 mg | ORAL_TABLET | Freq: Four times a day (QID) | ORAL | 0 refills | Status: DC | PRN

## 2016-03-16 MED ORDER — OXYCODONE-ACETAMINOPHEN 5-325 MG PO TABS
2.0000 | ORAL_TABLET | Freq: Once | ORAL | Status: AC
  Administered 2016-03-16: 2 via ORAL

## 2016-03-16 MED ORDER — OXYCODONE-ACETAMINOPHEN 5-325 MG PO TABS: 2.0000 | ORAL_TABLET | Freq: Four times a day (QID) | ORAL | 0 refills | Status: DC | PRN

## 2016-03-16 MED ORDER — PROMETHAZINE HCL 25 MG/ML IJ SOLN
25.0000 mg | Freq: Once | INTRAMUSCULAR | Status: AC
  Administered 2016-03-16: 25 mg via INTRAMUSCULAR

## 2016-03-16 MED ORDER — HYDROMORPHONE HCL 1 MG/ML IJ SOLN
INTRAMUSCULAR | Status: AC
  Administered 2016-03-16: 1 mg via INTRAMUSCULAR
  Filled 2016-03-16: qty 1

## 2016-03-16 MED ORDER — HYDROMORPHONE HCL 1 MG/ML IJ SOLN
1.0000 mg | Freq: Once | INTRAMUSCULAR | Status: AC
  Administered 2016-03-16: 1 mg via INTRAMUSCULAR

## 2016-03-16 MED ORDER — OXYCODONE-ACETAMINOPHEN 5-325 MG PO TABS
ORAL_TABLET | ORAL | Status: AC
  Filled 2016-03-16: qty 2

## 2016-03-16 NOTE — ED Triage Notes (Signed)
Pt has right lower abd pain.  Sx for 2 days.  Hx cyst on right ovary.  Pt has n/v.  Pt using phenergan with relief.  Pt alert.

## 2016-03-16 NOTE — ED Provider Notes (Addendum)
[facility] Emergency Department Provider Note         Time seen: ----------------------------------------- 9:08 PM on 03/16/2016 -----------------------------------------    I have reviewed the triage vital signs and the nursing notes.   HISTORY  Chief Complaint Abdominal Pain    HPI Carrie Marshall is a 40 y.o. female who presents to ER for right lower quadrant abdominal pain. Patient states she's had symptoms for 2 days. Reportedly she was here 2 weeks ago and diagnosed with a cyst on her right ovary. Patient states she's had nausea and vomiting and the pain has significantly worsened. She's been unable to tolerate much by mouth and has had vomiting. She does have outpatient follow-up scheduled but it was just pushed back by 2 more weeks.   Past Medical History:  Diagnosis Date  . Anxiety   . Depression     There are no active problems to display for this patient.   Past Surgical History:  Procedure Laterality Date  . APPENDECTOMY      Allergies Review of patient's allergies indicates no known allergies.  Social History Social History  Substance Use Topics  . Smoking status: Current Every Day Smoker    Packs/day: 1.00    Types: Cigarettes  . Smokeless tobacco: Never Used  . Alcohol use No    Review of Systems Constitutional: Negative for fever. Cardiovascular: Negative for chest pain. Respiratory: Negative for shortness of breath. Gastrointestinal: Positive for abdominal pain, vomiting Genitourinary: Negative for dysuria. Musculoskeletal: Negative for back pain. Skin: Negative for rash. Neurological: Negative for headaches, focal weakness or numbness.  10-point ROS otherwise negative.  ____________________________________________   PHYSICAL EXAM:  VITAL SIGNS: ED Triage Vitals  Enc Vitals Group     BP 03/16/16 1859 126/76     Pulse Rate 03/16/16 1859 81     Resp 03/16/16 1859 18     Temp 03/16/16 1859 98.2 F (36.8 C)      Temp Source 03/16/16 1859 Oral     SpO2 03/16/16 1859 98 %     Weight 03/16/16 1900 191 lb (86.6 kg)     Height 03/16/16 1900 5' 7"  (1.702 m)     Head Circumference --      Peak Flow --      Pain Score 03/16/16 1900 10     Pain Loc --      Pain Edu? --      Excl. in [place]? --     Constitutional: Alert and oriented. Well appearing and in no distress. Eyes: Conjunctivae are normal. PERRL. Normal extraocular movements. ENT   Head: Normocephalic and atraumatic.   Nose: No congestion/rhinnorhea.   Mouth/Throat: Mucous membranes are moist.   Neck: No stridor. Cardiovascular: Normal rate, regular rhythm. No murmurs, rubs, or gallops. Respiratory: Normal respiratory effort without tachypnea nor retractions. Breath sounds are clear and equal bilaterally. No wheezes/rales/rhonchi. Gastrointestinal: Right lower quadrant tenderness, no rebound or guarding. Normal bowel sounds. Musculoskeletal: Nontender with normal range of motion in all extremities. No lower extremity tenderness nor edema. Neurologic:  Normal speech and language. No gross focal neurologic deficits are appreciated.  Skin:  Skin is warm, dry and intact. No rash noted. Psychiatric: Mood and affect are normal. Speech and behavior are normal.  ____________________________________________  ED COURSE:  Pertinent labs & imaging results that were available during my care of the patient were reviewed by me and considered in my medical decision making (see chart for details). Clinical Course  Patient is no distress, we  will check basic labs and consider repeating her ultrasound.  Procedures ____________________________________________   LABS (pertinent positives/negatives)  Labs Reviewed  COMPREHENSIVE METABOLIC PANEL - Abnormal; Notable for the following:       Result Value   Glucose, Bld 116 (*)    All other components within normal limits  CBC - Abnormal; Notable for the following:    RBC 5.46 (*)    MCV 78.3  (*)    RDW 14.6 (*)    All other components within normal limits  URINALYSIS COMPLETEWITH MICROSCOPIC ([facility] ONLY) - Abnormal; Notable for the following:    Color, Urine YELLOW (*)    APPearance CLEAR (*)    Squamous Epithelial / LPF 0-5 (*)    All other components within normal limits  LIPASE, BLOOD  POC URINE PREG, ED  POCT PREGNANCY, URINE    RADIOLOGY  Pelvic ultrasound IMPRESSION: Normal uterus and ovaries. No abnormal pelvic fluid collections. Normal ovarian perfusion on Doppler.  ____________________________________________  FINAL ASSESSMENT AND PLAN  Pelvic pain, ovarian cyst  Plan: Patient with labs and imaging as dictated above. Labs and ultrasound are reassuring, she'll be discharged with pain medicine and antiemetics and encouraged to continue outpatient follow-up as scheduled.   Earleen Newport, MD   Note: This dictation was prepared with Dragon dictation. Any transcriptional errors that result from this process are unintentional    Earleen Newport, MD 03/16/16 2109    Earleen Newport, MD 03/16/16 [phone]

## 2016-03-16 NOTE — ED Notes (Signed)
Pt seen 3 weeks ago, diagnosed with ovarian cyst. Pt here to ED today for pain and nausea relief.

## 2016-03-16 NOTE — ED Notes (Signed)
Pharmacy called, spoke with Melissa. Phenergan IM order verified.

## 2016-03-22 NOTE — H&P (Signed)
Pt is scheduled for a Dx L/S   Carrie Marshall is a 40 y.o. female here for Referred by Elvera Bicker, PA ([facility])-right pelvic pain G6P6 .pt with 4 week h/o of right pelvic pain . Seen in ED at [facility] x 2 02/29/16 + 03/16/16. CT scan neg on 10/16  Except for cervical cyst and right ovarian cyst which was not seen on u/s . Repeat u/s 03/16/16 wnl . + trich on 10/16 /17 wit neg GC / chlamydia.( treated with flagyl and doxycycline )  No RPR of HIV done . LAst pap 2 yrs ago. Some N/V . Pain has been constant .SHe is a recovering narcotic user , clean now 5 yrs .    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  Past Surgical History:  has a past surgical history that includes Appendectomy and Tubal ligation (2015). Family History: family history is not on file. She was adopted. Social History:  reports that she has been smoking.  She has been smoking about 1.50 packs per day. She has never used smokeless tobacco. She reports that she drinks alcohol. She reports that she does not use drugs. OB/GYN History:          OB History    Gravida Para Term Preterm AB Living   6 6 6     6    SAB TAB Ectopic Molar Multiple Live Births             6      Allergies: has No Known Allergies. Medications:  Current Outpatient Prescriptions:  .  oxyCODONE-acetaminophen (PERCOCET) 5-325 mg tablet, Take by mouth., Disp: , Rfl:  .  promethazine (PHENERGAN) 25 MG tablet, Take by mouth., Disp: , Rfl:  .  metroNIDAZOLE (FLAGYL) 500 MG tablet, Take 1 tablet (500 mg total) by mouth 2 (two) times daily., Disp: 14 tablet, Rfl: 0  Review of Systems: General:                      No fatigue or weight loss Eyes:                           No vision changes Ears:                            No hearing difficulty Respiratory:                No cough or shortness of breath Pulmonary:                  No asthma or shortness of  breath Cardiovascular:           No chest pain, palpitations, dyspnea on exertion Gastrointestinal:          No abdominal bloating, chronic diarrhea, constipations, masses, pain or hematochezia Genitourinary:             No hematuria, dysuria, abnormal vaginal discharge, pelvic pain, Menometrorrhagia Lymphatic:                   No swollen lymph nodes Musculoskeletal:         No muscle weakness Neurologic:                  No extremity weakness, syncope, seizure disorder Psychiatric:  No history of depression, delusions or suicidal/homicidal ideation    Exam:      Vitals:   03/21/16 1513  BP: 113/73  Pulse: 76    Body mass index is 30.07 kg/m.  WDWN white/  female in NAD   Lungs: CTA  CV : RRR without murmur   Breast: exam done in sitting and lying position : No dimpling or retraction, no dominant mass, no spontaneous discharge, no axillary adenopathy Neck:  no thyromegaly Abdomen: soft , no mass, normal active bowel sounds,  + ttp rlq , no rebound Pelvic: tanner stage 5 ,  External genitalia: vulva /labia no lesions Urethra: no prolapse Vagina: + creamy d/c No CMT , right adnexal TTP( mild) , no fullness wet mount ;+ clue cells  Cervix: no lesions, no cervical motion tenderness   Uterus: normal size shape and contour, non-tender Adnexa: no mass,  non-tender    Impression:   The primary encounter diagnosis was Female pelvic pain. Diagnoses of Screening for STD (sexually transmitted disease), Cervical cancer screening, Leukorrhea, and BV (bacterial vaginosis) were also pertinent to this visit.  Acute onset of continued pelvic pain . Possible salpingitis  Given her recent STD . Pelvic adhesive ds is also within differential   Plan:    L/S and possible LOA ,     Pt counseled regarding the risks of Dx L/S . All questions answered  Re treat with flagyl bid x 7 days       Orders Placed This Encounter  Procedures  . X-ray abdomen flat and  upright    Standing Status:   Future    Standing Expiration Date:   03/21/2017    Order Specific Question:   Reason for Exam:    Answer:   right pelvic pain    Order Specific Question:   Is the patient pregnant?    Answer:   No  . Rapid HIV  . RPR - [facility]  . CBC w/auto Differential (5 Part)  . Hepatic Function Panel (HFP)  . Wet Prep  . Pap IG, Ct-Ng, HPV-hr - [facility]    Order Specific Question:   [facility] Specimen source for cytology test:    Answer:   Cervical    Order Specific Question:   [facility] Collection method:    Answer:   Bryan Lemma    Return if symptoms worsen or fail to improve.  Caroline Sauger, MD       Electronically signed by Caroline Sauger, MD at 03/21/2016 4:32 PM      Initial consult on 03/21/2016       Note shared with patient  Department   Name Address Phone Fax  [facility] [place] [phone]  Service Location   Name Address    [address] [address]

## 2016-03-24 ENCOUNTER — Encounter
Admission: RE | Disposition: A | Discharge status: Home / Self Care | Payer: Self-pay | Source: Ambulatory Visit | Attending: Obstetrics and Gynecology

## 2016-03-24 ENCOUNTER — Encounter: Payer: Self-pay | Admitting: Anesthesiology

## 2016-03-24 ENCOUNTER — Ambulatory Visit
Admission: RE | Admit: 2016-03-24 | Discharge: 2016-03-24 | Disposition: A | Discharge status: Home / Self Care | Payer: Medicaid Other | Source: Ambulatory Visit | Attending: Obstetrics and Gynecology | Admitting: Obstetrics and Gynecology

## 2016-03-24 ENCOUNTER — Ambulatory Visit: Payer: Medicaid Other | Admitting: Anesthesiology

## 2016-03-24 DIAGNOSIS — G4700 Insomnia, unspecified: Secondary | ICD-10-CM | POA: Insufficient documentation

## 2016-03-24 DIAGNOSIS — N736 Female pelvic peritoneal adhesions (postinfective): Secondary | ICD-10-CM | POA: Diagnosis not present

## 2016-03-24 DIAGNOSIS — F329 Major depressive disorder, single episode, unspecified: Secondary | ICD-10-CM | POA: Diagnosis not present

## 2016-03-24 DIAGNOSIS — N838 Other noninflammatory disorders of ovary, fallopian tube and broad ligament: Secondary | ICD-10-CM | POA: Diagnosis not present

## 2016-03-24 DIAGNOSIS — F17210 Nicotine dependence, cigarettes, uncomplicated: Secondary | ICD-10-CM | POA: Diagnosis not present

## 2016-03-24 DIAGNOSIS — Z79899 Other long term (current) drug therapy: Secondary | ICD-10-CM | POA: Insufficient documentation

## 2016-03-24 DIAGNOSIS — R102 Pelvic and perineal pain: Secondary | ICD-10-CM | POA: Diagnosis present

## 2016-03-24 HISTORY — PX: LAPAROSCOPY: SHX197

## 2016-03-24 HISTORY — PX: LAPAROSCOPIC LYSIS OF ADHESIONS: SHX5905

## 2016-03-24 LAB — BASIC METABOLIC PANEL
Anion gap: 7 (ref 5–15)
BUN: 10 mg/dL (ref 6–20)
CHLORIDE: 109 mmol/L (ref 101–111)
CO2: 22 mmol/L (ref 22–32)
CREATININE: 0.71 mg/dL (ref 0.44–1.00)
Calcium: 8.8 mg/dL — ABNORMAL LOW (ref 8.9–10.3)
GFR calc Af Amer: >60 mL/min (ref >60)
GFR calc non Af Amer: >60 mL/min (ref >60)
GLUCOSE: 90 mg/dL (ref 65–99)
POTASSIUM: 3.5 mmol/L (ref 3.5–5.1)
SODIUM: 138 mmol/L (ref 135–145)

## 2016-03-24 LAB — CBC
HCT: 43.3 % (ref 35.0–47.0)
HEMOGLOBIN: 14.4 g/dL (ref 12.0–16.0)
MCH: 25.9 pg — AB (ref 26.0–34.0)
MCHC: 33.2 g/dL (ref 32.0–36.0)
MCV: 78 fL — AB (ref 80.0–100.0)
PLATELETS: 213 10*3/uL (ref 150–440)
RBC: 5.55 MIL/uL — AB (ref 3.80–5.20)
RDW: 14.9 % — ABNORMAL HIGH (ref 11.5–14.5)
WBC: 8.7 10*3/uL (ref 3.6–11.0)

## 2016-03-24 LAB — TYPE AND SCREEN
ABO/RH(D): O POS
Antibody Screen: NEGATIVE

## 2016-03-24 SURGERY — LAPAROSCOPY, DIAGNOSTIC
Anesthesia: General

## 2016-03-24 MED ORDER — LIDOCAINE HCL (PF) 1 % IJ SOLN
INTRAMUSCULAR | Status: AC
  Filled 2016-03-24: qty 2

## 2016-03-24 MED ORDER — ONDANSETRON HCL 4 MG/2ML IJ SOLN
INTRAMUSCULAR | Status: DC | PRN
Start: 2016-03-24 — End: 2016-03-24
  Administered 2016-03-24: 4 mg via INTRAVENOUS

## 2016-03-24 MED ORDER — DEXAMETHASONE SODIUM PHOSPHATE 10 MG/ML IJ SOLN
INTRAMUSCULAR | Status: DC | PRN
  Administered 2016-03-24: 10 mg via INTRAVENOUS

## 2016-03-24 MED ORDER — SILVER NITRATE-POT NITRATE 75-25 % EX MISC
CUTANEOUS | Status: AC
  Filled 2016-03-24: qty 1

## 2016-03-24 MED ORDER — LIDOCAINE HCL (CARDIAC) 20 MG/ML IV SOLN
INTRAVENOUS | Status: DC | PRN
  Administered 2016-03-24: 30 mg via INTRAVENOUS

## 2016-03-24 MED ORDER — BUPIVACAINE HCL 0.5 % IJ SOLN
INTRAMUSCULAR | Status: DC | PRN
  Administered 2016-03-24: 25 mL

## 2016-03-24 MED ORDER — BUPIVACAINE HCL (PF) 0.5 % IJ SOLN
INTRAMUSCULAR | Status: AC
  Filled 2016-03-24: qty 30

## 2016-03-24 MED ORDER — SUCCINYLCHOLINE CHLORIDE 20 MG/ML IJ SOLN
INTRAMUSCULAR | Status: DC | PRN
  Administered 2016-03-24: 100 mg via INTRAVENOUS

## 2016-03-24 MED ORDER — ONDANSETRON HCL 4 MG/2ML IJ SOLN
4.0000 mg | Freq: Once | INTRAMUSCULAR | Status: AC | PRN
Start: 2016-03-24 — End: 2016-03-24
  Administered 2016-03-24: 4 mg via INTRAVENOUS

## 2016-03-24 MED ORDER — ACETAMINOPHEN 10 MG/ML IV SOLN
INTRAVENOUS | Status: DC | PRN
  Administered 2016-03-24: 1000 mg via INTRAVENOUS

## 2016-03-24 MED ORDER — ROCURONIUM BROMIDE 100 MG/10ML IV SOLN
INTRAVENOUS | Status: DC | PRN
  Administered 2016-03-24: 10 mg via INTRAVENOUS
  Administered 2016-03-24: 20 mg via INTRAVENOUS
  Administered 2016-03-24: 10 mg via INTRAVENOUS

## 2016-03-24 MED ORDER — MIDAZOLAM HCL 2 MG/2ML IJ SOLN
INTRAMUSCULAR | Status: DC | PRN
  Administered 2016-03-24: 1 mg via INTRAVENOUS

## 2016-03-24 MED ORDER — ACETAMINOPHEN 10 MG/ML IV SOLN
INTRAVENOUS | Status: AC
  Filled 2016-03-24: qty 100

## 2016-03-24 MED ORDER — KETOROLAC TROMETHAMINE 30 MG/ML IJ SOLN
INTRAMUSCULAR | Status: DC | PRN
  Administered 2016-03-24: 30 mg via INTRAVENOUS

## 2016-03-24 MED ORDER — FENTANYL CITRATE (PF) 100 MCG/2ML IJ SOLN
INTRAMUSCULAR | Status: DC | PRN
  Administered 2016-03-24 (×4): 50 ug via INTRAVENOUS

## 2016-03-24 MED ORDER — FLEET ENEMA 7-19 GM/118ML RE ENEM: 1.0000 | ENEMA | Freq: Once | RECTAL | Status: DC

## 2016-03-24 MED ORDER — SOD CITRATE-CITRIC ACID 500-334 MG/5ML PO SOLN
30.0000 mL | ORAL | Status: DC
  Filled 2016-03-24: qty 30

## 2016-03-24 MED ORDER — FENTANYL CITRATE (PF) 100 MCG/2ML IJ SOLN
INTRAMUSCULAR | Status: DC
Start: 2016-03-24 — End: 2016-03-24
  Filled 2016-03-24: qty 2

## 2016-03-24 MED ORDER — PROPOFOL 10 MG/ML IV BOLUS
INTRAVENOUS | Status: DC | PRN
  Administered 2016-03-24: 50 mg via INTRAVENOUS
  Administered 2016-03-24: 150 mg via INTRAVENOUS

## 2016-03-24 MED ORDER — FENTANYL CITRATE (PF) 100 MCG/2ML IJ SOLN
25.0000 ug | INTRAMUSCULAR | Status: AC | PRN
  Administered 2016-03-24 (×6): 25 ug via INTRAVENOUS

## 2016-03-24 MED ORDER — ONDANSETRON HCL 4 MG/2ML IJ SOLN
INTRAMUSCULAR | Status: AC
  Filled 2016-03-24: qty 2

## 2016-03-24 MED ORDER — FAMOTIDINE 20 MG PO TABS
20.0000 mg | ORAL_TABLET | Freq: Once | ORAL | Status: AC
  Administered 2016-03-24: 20 mg via ORAL

## 2016-03-24 MED ORDER — LACTATED RINGERS IV SOLN
INTRAVENOUS | Status: DC
  Administered 2016-03-24: 10:00:00 via INTRAVENOUS

## 2016-03-24 MED ORDER — LIDOCAINE HCL 2 % IJ SOLN
0.1000 mL | Freq: Once | INTRAMUSCULAR | Status: DC
  Filled 2016-03-24: qty 10

## 2016-03-24 MED ORDER — FAMOTIDINE 20 MG PO TABS
ORAL_TABLET | ORAL | Status: AC
  Administered 2016-03-24: 20 mg via ORAL
  Filled 2016-03-24: qty 1

## 2016-03-24 MED ORDER — SUGAMMADEX SODIUM 200 MG/2ML IV SOLN
INTRAVENOUS | Status: DC | PRN
  Administered 2016-03-24: 180 mg via INTRAVENOUS

## 2016-03-24 MED ORDER — SILVER NITRATE-POT NITRATE 75-25 % EX MISC
CUTANEOUS | Status: AC
  Filled 2016-03-24: qty 5

## 2016-03-24 MED ORDER — FENTANYL CITRATE (PF) 100 MCG/2ML IJ SOLN
INTRAMUSCULAR | Status: AC
  Filled 2016-03-24: qty 2

## 2016-03-24 SURGICAL SUPPLY — 42 items
ANCHOR TIS RET SYS 235ML (MISCELLANEOUS) ×4 IMPLANT
BLADE SURG SZ11 CARB STEEL (BLADE) ×4 IMPLANT
CANISTER SUCT 1200ML W/VALVE (MISCELLANEOUS) ×4 IMPLANT
CATH FOLEY 2WAY  5CC 16FR (CATHETERS)
CATH ROBINSON RED A/P 16FR (CATHETERS) ×4 IMPLANT
CATH URTH 16FR FL 2W BLN LF (CATHETERS) IMPLANT
CHLORAPREP W/TINT 26ML (MISCELLANEOUS) ×4 IMPLANT
CLOSURE WOUND 1/2 X4 (GAUZE/BANDAGES/DRESSINGS) ×1
DERMABOND ADVANCED (GAUZE/BANDAGES/DRESSINGS) ×2
DERMABOND ADVANCED .7 DNX12 (GAUZE/BANDAGES/DRESSINGS) ×2 IMPLANT
DEVICE PMI PUNCTURE CLOSURE (MISCELLANEOUS) ×4 IMPLANT
DRSG TEGADERM 2-3/8X2-3/4 SM (GAUZE/BANDAGES/DRESSINGS) ×12 IMPLANT
GAUZE SPONGE NON-WVN 2X2 STRL (MISCELLANEOUS) ×4 IMPLANT
GLOVE BIO SURGEON STRL SZ8 (GLOVE) ×8 IMPLANT
GOWN STRL REUS W/ TWL LRG LVL3 (GOWN DISPOSABLE) ×2 IMPLANT
GOWN STRL REUS W/ TWL XL LVL3 (GOWN DISPOSABLE) ×2 IMPLANT
GOWN STRL REUS W/TWL LRG LVL3 (GOWN DISPOSABLE) ×2
GOWN STRL REUS W/TWL XL LVL3 (GOWN DISPOSABLE) ×2
GRASPER SUT TROCAR 14GX15 (MISCELLANEOUS) ×4 IMPLANT
IRRIGATION STRYKERFLOW (MISCELLANEOUS) IMPLANT
IRRIGATOR STRYKERFLOW (MISCELLANEOUS)
IV NS 1000ML (IV SOLUTION) ×2
IV NS 1000ML BAXH (IV SOLUTION) ×2 IMPLANT
KIT RM TURNOVER CYSTO AR (KITS) ×4 IMPLANT
LABEL OR SOLS (LABEL) ×4 IMPLANT
NS IRRIG 500ML POUR BTL (IV SOLUTION) ×4 IMPLANT
PACK GYN LAPAROSCOPIC (MISCELLANEOUS) ×4 IMPLANT
PAD OB MATERNITY 4.3X12.25 (PERSONAL CARE ITEMS) ×4 IMPLANT
PAD PREP 24X41 OB/GYN DISP (PERSONAL CARE ITEMS) ×4 IMPLANT
SCISSORS METZENBAUM CVD 33 (INSTRUMENTS) ×4 IMPLANT
SHEARS HARMONIC ACE PLUS 36CM (ENDOMECHANICALS) IMPLANT
SLEEVE ENDOPATH XCEL 5M (ENDOMECHANICALS) ×4 IMPLANT
SPONGE VERSALON 2X2 STRL (MISCELLANEOUS) ×4
STRIP CLOSURE SKIN 1/2X4 (GAUZE/BANDAGES/DRESSINGS) ×3 IMPLANT
SUT VIC AB 2-0 UR6 27 (SUTURE) ×12 IMPLANT
SUT VIC AB 4-0 SH 27 (SUTURE) ×4
SUT VIC AB 4-0 SH 27XANBCTRL (SUTURE) ×4 IMPLANT
SWABSTK COMLB BENZOIN TINCTURE (MISCELLANEOUS) ×4 IMPLANT
TROCAR ENDO BLADELESS 11MM (ENDOMECHANICALS) ×4 IMPLANT
TROCAR XCEL NON-BLD 5MMX100MML (ENDOMECHANICALS) ×4 IMPLANT
TROCAR XCEL UNIV SLVE 11M 100M (ENDOMECHANICALS) ×4 IMPLANT
TUBING INSUFFLATOR HI FLOW (MISCELLANEOUS) ×4 IMPLANT

## 2016-03-24 NOTE — Transfer of Care (Signed)
Immediate Anesthesia Transfer of Care Note  Patient: Carrie Marshall  Procedure(s) Performed: Procedure(s): LAPAROSCOPY DIAGNOSTIC (N/A) LAPAROSCOPY OPERATIVE (N/A) LAPAROSCOPIC LYSIS OF ADHESIONS  Patient Location: PACU  Anesthesia Type:General  Level of Consciousness: sedated  Airway & Oxygen Therapy: Patient Spontanous Breathing and Patient connected to face mask oxygen  Post-op Assessment: Report given to RN and Post -op Vital signs reviewed and stable  Post vital signs: Reviewed and stable  Last Vitals:  Vitals:   03/24/16 0906 03/24/16 1131  BP: 117/78 121/73  Pulse: 67 96  Resp: 16 (!) 22  Temp: 36.6 C 36.4 C    Last Pain:  Vitals:   03/24/16 1131  TempSrc: Tympanic  PainSc:          Complications: No apparent anesthesia complications

## 2016-03-24 NOTE — Anesthesia Procedure Notes (Signed)
Procedure Name: Intubation Date/Time: 03/24/2016 10:08 AM Performed by: Ginger CarneMICHELET, Mackenzie Lia Pre-anesthesia Checklist: Patient identified, Emergency Drugs available, Suction available, Patient being monitored and Timeout performed Patient Re-evaluated:Patient Re-evaluated prior to inductionOxygen Delivery Method: Circle system utilized Preoxygenation: Pre-oxygenation with 100% oxygen Intubation Type: IV induction Ventilation: Mask ventilation without difficulty Laryngoscope Size: Miller and 2 Grade View: Grade I Tube type: Oral Tube size: 7.5 mm Number of attempts: 1 Airway Equipment and Method: Stylet Placement Confirmation: ETT inserted through vocal cords under direct vision,  positive ETCO2 and breath sounds checked- equal and bilateral Secured at: 21 cm Tube secured with: Tape Dental Injury: Teeth and Oropharynx as per pre-operative assessment

## 2016-03-24 NOTE — Anesthesia Preprocedure Evaluation (Signed)
Anesthesia Evaluation  Patient identified by MRN, date of birth, ID band Patient awake    Reviewed: Allergy & Precautions, NPO status , Patient's Chart, lab work & pertinent test results, reviewed documented beta blocker date and time   Airway Mallampati: II  TM Distance: >3 FB     Dental  (+) Chipped   Pulmonary Current Smoker,           Cardiovascular      Neuro/Psych PSYCHIATRIC DISORDERS Anxiety Depression    GI/Hepatic   Endo/Other    Renal/GU      Musculoskeletal   Abdominal   Peds  Hematology   Anesthesia Other Findings   Reproductive/Obstetrics                             Anesthesia Physical Anesthesia Plan  ASA: II  Anesthesia Plan: General   Post-op Pain Management:    Induction: Intravenous  Airway Management Planned: Oral ETT  Additional Equipment:   Intra-op Plan:   Post-operative Plan:   Informed Consent: I have reviewed the patients History and Physical, chart, labs and discussed the procedure including the risks, benefits and alternatives for the proposed anesthesia with the patient or authorized representative who has indicated his/her understanding and acceptance.     Plan Discussed with: CRNA  Anesthesia Plan Comments:         Anesthesia Quick Evaluation

## 2016-03-24 NOTE — Anesthesia Postprocedure Evaluation (Signed)
Anesthesia Post Note  Patient: Carrie Marshall  Procedure(Marshall) Performed: Procedure(Marshall) (LRB): LAPAROSCOPY DIAGNOSTIC (N/A) LAPAROSCOPY OPERATIVE (N/A) LAPAROSCOPIC LYSIS OF ADHESIONS  Patient location during evaluation: PACU Anesthesia Type: General Level of consciousness: awake and alert Pain management: pain level controlled Vital Signs Assessment: post-procedure vital signs reviewed and stable Respiratory status: spontaneous breathing, nonlabored ventilation, respiratory function stable and patient connected to nasal cannula oxygen Cardiovascular status: blood pressure returned to baseline and stable Postop Assessment: no signs of nausea or vomiting Anesthetic complications: no    Last Vitals:  Vitals:   03/24/16 1150 03/24/16 1155  BP:  110/81  Pulse: 89 80  Resp: 15 11  Temp:      Last Pain:  Vitals:   03/24/16 1155  TempSrc:   PainSc: 4                  Carrie Marshall

## 2016-03-24 NOTE — Progress Notes (Signed)
Pt ready for Dx L/S for right side pelvic pain . NPO . Urine HCG neg . All questions answered

## 2016-03-24 NOTE — Brief Op Note (Signed)
03/24/2016  11:18 AM  PATIENT:  Carrie Marshall  40 y.o. female  PRE-OPERATIVE DIAGNOSIS:  Right Pelvic Pain  POST-OPERATIVE DIAGNOSIS:  Right Pelvic Pain Pelvic adhesions ( mild ) , allen master window c/w endometriosis Perihepatic adhesions   PROCEDURE: Laparoscopic excision of endometriosis Lysis of ahdesions SURGEON:  Surgeon(s) and Role:    Boykin Nearing, MD - Primary  PHYSICIAN ASSISTANT: scrub tech   ASSISTANTS: none   ANESTHESIA:   general  EBL:  Total I/O In: 700 [I.V.:700] Out: 73 [Urine:50; Blood:5]  BLOOD ADMINISTERED:none  DRAINS: none   LOCAL MEDICATIONS USED:  MARCAINE 0.5%    and Amount: 25 ml  SPECIMEN:  Source of Specimen:  excision of peritoneum , c/e endometriosis  DISPOSITION OF SPECIMEN:  PATHOLOGY  COUNTS:  YES  TOURNIQUET:  * No tourniquets in log *  DICTATION: .Other Dictation: Dictation Number verbal  PLAN OF CARE: Discharge to home after PACU  PATIENT DISPOSITION:  PACU - hemodynamically stable.   Delay start of Pharmacological VTE agent (>24hrs) due to surgical blood loss or risk of bleeding: not applicable

## 2016-03-25 LAB — SURGICAL PATHOLOGY

## 2016-03-25 NOTE — Op Note (Addendum)
NAME:  Carrie Marshall, Carrie Marshall               ACCOUNT NO.:  [id number]  MEDICAL RECORD NO.:  [id number]  LOCATION:  [facility]                         FACILITY:  [facility]  PHYSICIAN:  Laverta Baltimore, MDDATE OF BIRTH:  06-26-1975  DATE OF PROCEDURE: DATE OF DISCHARGE:  03/24/2016                              OPERATIVE REPORT   PREOPERATIVE DIAGNOSIS:  Chronic pelvic pain, right-sided.  POSTOPERATIVE DIAGNOSIS: 1. Pelvic adhesions. 2. Carrie Marshall windows consistent with endometriosis. 3. Perihepatic adhesions consistent with prior infection.  PROCEDURES: 1. Diagnostic laparoscopy. 2. Lysis of adhesions. 3. Excision of endometriosis.  FIRST ASSISTANT:  Scrub tech.  ANESTHESIA:  General endotracheal anesthesia.  INDICATION:  A 40 year old, gravida 6, para 6, the patient with a 1 month history of right lower quadrant pain that is not resolved with conservative treatment.  DESCRIPTION OF PROCEDURE:  After adequate general endotracheal anesthesia, the patient was placed in dorsal supine position.  Legs were placed in the Carrie Marshall.  The patient was prepped and draped in normal sterile fashion.  Time-out was performed.  Straight catheterization of the bladder yielded 50 mL clear urine.  Speculum was placed in the vagina and the anterior cervix was grasped with a single- tooth tenaculum and Kahn cannula was placed in the endocervical canal to be used for uterine manipulation during the procedure.  Gloves were changed.  Attention was directed to the patient's abdomen.  A 15 mm infraumbilical incision was made after injecting with 0.5% Marcaine. The laparoscope was advanced into the abdominal cavity utilizing the Optiview cannula.  Once inside the abdominal cavity, the patient's abdomen was insufflated with carbon dioxide.  Second port site was placed 3 cm medial to the left anterior iliac spine again to the left anterior iliac spine and the initial impression of the pelvis revealed some  adhesions from the left sidewall to the left anterior abdominal wall.  There were mild adhesions from the left ovarian fossa to the left sidewall and several peritoneal pouches  in the deep cul-de-sac consistent with Carrie Marshall.  Third port site was placed in the right lower quadrant again 3 cm medial to the right anterior iliac spine and a 5 mm port was advanced under direct visualization.  The filmy adhesions were taken down from the left sidewall on the left ovarian fossa, and the largest Carrie Marshall window was grasped at the deepest portion and everted and Harmonic Scalpel was used to excise this window.  This tissue will be sent to Pathology for identification.  The upper abdomen showed several adhesions from the liver to the anterior abdominal wall again consistent with Carrie Marshall syndrome.  No other adhesions were identified. The pressure was lowered to 7 mmHg.  Good hemostasis was noted.  The port sites were removed.  Again, good hemostasis was noted and the infraumbilical port was removed as well.  All port sites rather were injected with 0.5% Marcaine utilizing 25 mL.  The infraumbilical fascia was closed with a 2-0 Vicryl suture and then all skin was closed with interrupted 4-0 Vicryl suture.  During the procedure, the patient did slide cephalad during placement from Trendelenburg.  The patient had to be readjusted during the procedure.  No other complications.  Single- tooth tenaculum and Kahn cannula were removed from the vagina.  No active bleeding.  ESTIMATED BLOOD LOSS:  Minimal.  INTRAOPERATIVE FLUIDS:  500 mL.  URINE OUTPUT:  50 mL.  The patient was taken to recovery room in good condition.          ______________________________ Laverta Baltimore, MD     TS/MEDQ  D:  03/24/2016  T:  03/25/2016  Job:  [id number]

## 2016-10-17 ENCOUNTER — Ambulatory Visit
Admission: RE | Admit: 2016-10-17 | Discharge: 2016-10-17 | Disposition: A | Discharge status: Home / Self Care | Payer: Medicaid Other | Source: Ambulatory Visit | Attending: Nurse Practitioner | Admitting: Nurse Practitioner

## 2016-10-17 ENCOUNTER — Other Ambulatory Visit: Payer: Self-pay | Admitting: Nurse Practitioner

## 2016-10-17 DIAGNOSIS — J4521 Mild intermittent asthma with (acute) exacerbation: Secondary | ICD-10-CM

## 2016-10-17 DIAGNOSIS — R0602 Shortness of breath: Secondary | ICD-10-CM | POA: Diagnosis not present

## 2016-10-17 DIAGNOSIS — F17200 Nicotine dependence, unspecified, uncomplicated: Secondary | ICD-10-CM

## 2016-10-17 DIAGNOSIS — J45901 Unspecified asthma with (acute) exacerbation: Secondary | ICD-10-CM | POA: Diagnosis not present

## 2016-12-14 NOTE — H&P (Signed)
Carrie Sauger, MD  Obstetrics and Gynecology    [] Hide copied text  Chief Complaint:    Carrie Marshall is a 41 y.o. female here for LAVH , RSO and left salpingectomy . Carrie Marshall a 41 y.o.femalehere for Follow-up. Her pain is no better on continuous OCP . Now with heavy bleeding /mouth  .pt is here for f/up for CPP . Pt underwent L/S 04/03/2016 that showed pelvic endosalpingiosis and she was started on Depo Lupron. In Dec 2017 and never followed up . Her pain did not significantly improve and she had significant hot flashes . Pain is bilateral . + dysparuenia , but has only engage in sex a few times . Taking Aygestin , but not helping much . Taking Motrin 800 mg TID  Drug addiction 6 yrs ago  Pt desires definitive surgery  Past Medical History:  has a past medical history of Addiction to drug (CMS-HCC); Asthma without status asthmaticus, unspecified; COPD (chronic obstructive pulmonary disease) , unspecified (CMS-HCC); Depression, unspecified; and insomnia.  Past Surgical History:  has a past surgical history that includes Appendectomy; Tubal ligation (2015); and Dx Laparscopy-excision of Endosalpingosis (03/24/2016). Family History: family history is not on file. She was adopted. Social History:  reports that she has been smoking Cigarettes.  She has a 48.00 pack-year smoking history. She has never used smokeless tobacco. She reports that she drinks alcohol. She reports that she does not use drugs. OB/GYN History:          OB History    Gravida Para Term Preterm AB Living   6 6 6     6    SAB TAB Ectopic Molar Multiple Live Births             6      Allergies: is allergic to gabapentin and chantix [varenicline]. Medications:  Current Outpatient Prescriptions:  .  albuterol 90 mcg/actuation inhaler, Inhale 2 inhalations into the lungs every 6 (six) hours as needed for Wheezing., Disp: , Rfl:  .  fluticasone (FLONASE) 50 mcg/actuation nasal spray, Place 2  sprays into both nostrils once daily., Disp: 16 g, Rfl: 3 .  fluticasone-salmeterol (ADVAIR HFA) 115-21 mcg/actuation inhaler, Inhale 2 inhalations into the lungs every 12 (twelve) hours., Disp: 1 Inhaler, Rfl: 5 .  montelukast (SINGULAIR) 10 mg tablet, Take 1 tablet (10 mg total) by mouth nightly., Disp: 30 tablet, Rfl: 3 .  omeprazole (PRILOSEC) 40 MG DR capsule, Take 1 capsule (40 mg total) by mouth once daily., Disp: 30 capsule, Rfl: 3 .  levonorgestrel-ethinyl estradiol (SEASONALE) 0.15 mg-30 mcg tablet, Take 1 tablet by mouth once daily. (Patient not taking: Reported on 12/14/2016 ), Disp: 84 tablet, Rfl: 3  Review of Systems: General:                      No fatigue or weight loss Eyes:                           No vision changes Ears:                            No hearing difficulty Respiratory:                No cough or shortness of breath Pulmonary:                  No asthma or shortness of breath Cardiovascular:  No chest pain, palpitations, dyspnea on exertion Gastrointestinal:          No abdominal bloating, chronic diarrhea, constipations, masses, pain or hematochezia Genitourinary:             No hematuria, dysuria, abnormal vaginal discharge, pelvic pain, Menometrorrhagia Lymphatic:                   No swollen lymph nodes Musculoskeletal:         No muscle weakness Neurologic:                  No extremity weakness, syncope, seizure disorder Psychiatric:                  No history of depression, delusions or suicidal/homicidal ideation    Exam:      Vitals:   12/14/16 1411  BP: 108/83  Pulse: 81    Body mass index is 30.83 kg/m.  WDWN white/ female in NAD   Lungs: CTA  CV : RRR without murmur   Breast: exam done in sitting and lying position : No dimpling or retraction, no dominant mass, no spontaneous discharge, no axillary adenopathy Neck:  no thyromegaly Abdomen: soft , no mass, normal active bowel sounds,  non-tender, no rebound  tenderness Pelvic: tanner stage 5 ,  External genitalia: vulva /labia no lesions Urethra: no prolapse Vagina: normal physiologic d/c Cervix: no lesions, no cervical motion tenderness   Uterus: normal size shape and contour, non-tender Adnexa: no mass,  non-tender   Rectovaginal:  Impression:   The primary encounter diagnosis was Chronic pelvic pain in female. Diagnoses of Endosalpingiosis and Menorrhagia with regular cycle were also pertinent to this visit.    Plan:  I have spoken with the patient regarding treatment options including expectant management, hormonal options, or surgical intervention. After a full discussion the pt elects to proceed with LAVH / RSO and Left salpingectomy  Benefits and risks to surgery: The proposed benefit of the surgery has been discussed with the patient. The possible risks include, but are not limited to: organ injury to the bowel , bladder, ureters, and major blood vessels and nerves. There is a possibility of additional surgeries resulting from these injuries. There is also the risk of blood transfusion and the need to receive blood products during or after the procedure which may rarely lead to HIV or Hepatitis C infection. There is a risk of developing a deep venous thrombosis or a pulmonary embolism . There is the possibility of wound infection and also anesthetic complications, even the rare possibility of death. The patient understands these risks and wishes to proceed. All questions have been answered and the consent has been signed.

## 2016-12-14 NOTE — H&P (Signed)
Carrie Marshall is a 41 y.o. female here for LAVH/ RSO and left salpingectomy .pt is here for f/up for CPP and menorrhagia  . Pt underwent L/S 04/03/2016 that showed pelvic endosalpingiosis and she was started on Depo Lupron. In Dec 2017 and never followed up . Her pain did not significantly improve and she had significant hot flashes . Pain is bilateral R>L . + dysparuenia , but has only engage in sex a few times . Taking Aygestin , but not helping much .  Taking Motrin 800 mg TID  Drug addiction 6 yrs ago. See did try continuous OCPs which did not improve her pain and only made the bleeding worse   She wishes definitive surgery   Past Medical History:  has a past medical history of Addiction to drug (CMS-HCC); Depression, unspecified; and insomnia.  Past Surgical History:  has a past surgical history that includes Appendectomy; Tubal ligation (2015); and Dx Laparscopy-excision of Endosalpingosis (03/24/2016). Family History: family history is not on file. She was adopted. Social History:  reports that she has been smoking.  She has been smoking about 1.50 packs per day. She has never used smokeless tobacco. She reports that she drinks alcohol. She reports that she does not use drugs. OB/GYN History:          OB History    Gravida Para Term Preterm AB Living   6 6 6     6    SAB TAB Ectopic Molar Multiple Live Births             6      Allergies: has No Known Allergies. Medications:  Current Outpatient Prescriptions:  .  hydrOXYzine (VISTARIL) 50 MG capsule, Take by mouth., Disp: , Rfl:  .  ibuprofen (ADVIL,MOTRIN) 800 MG tablet, Take 1 tablet (800 mg total) by mouth every 6 (six) hours as needed for Pain., Disp: 30 tablet, Rfl: 0 .  venlafaxine (EFFEXOR-XR) 75 MG XR capsule, Take by mouth., Disp: , Rfl:  .  gabapentin (NEURONTIN) 300 MG capsule, Take 1 capsule (300 mg total) by mouth once daily., Disp: 30 capsule, Rfl: 0 .  levonorgestrel-ethinyl estradiol (SEASONALE) 0.15 mg-30 mcg  tablet, Take 1 tablet by mouth once daily., Disp: 84 tablet, Rfl: 3 .  traMADol (ULTRAM-ER) 100 MG ER tablet, Take 1 tablet (100 mg total) by mouth once daily as needed for Pain., Disp: 20 tablet, Rfl: 0  Review of Systems: General:                      No fatigue or weight loss Eyes:                           No vision changes Ears:                            No hearing difficulty Respiratory:                No cough or shortness of breath Pulmonary:                  No asthma or shortness of breath Cardiovascular:           No chest pain, palpitations, dyspnea on exertion Gastrointestinal:          No abdominal bloating, chronic diarrhea, constipations, masses, pain or hematochezia Genitourinary:  No hematuria, dysuria, abnormal vaginal discharge, pelvic pain, Menometrorrhagia, + pelvic pain  Lymphatic:                   No swollen lymph nodes Musculoskeletal:         No muscle weakness Neurologic:                  No extremity weakness, syncope, seizure disorder Psychiatric:                  + drug addiction No history of depression, delusions or suicidal/homicidal ideation    Exam:      Vitals:   08/16/16 1107  BP: 96/67  Pulse: 71    Body mass index is 30.23 kg/m.  WDWN white/  female in NAD   Lungs: CTA  CV : RRR without murmur    Neck:  no thyromegaly Abdomen: soft , no mass, normal active bowel sounds,  non-tender, no rebound tenderness Pelvic: tanner stage 5 ,  External genitalia: vulva /labia no lesions Urethra: no prolapse Vagina: normal physiologic d/c Cervix: no lesions, + cervical motion tenderness   Uterus: normal size shape and contour, non-tender Adnexa: no mass,  non-tender    Impression:   The primary encounter diagnosis was Chronic pelvic pain in female. A diagnosis of Endosalpingiosis was also pertinent to this visit.Significant menorrhagia now not better tried on seasonale    Plan:   LAVH / RSO ( since most of her pain  is on the right )  Left salpingectomy  Pt has been counseled on the risks of the procedure and the potential that surgery will not improve her pain and additional surgery ( left oophorectomy ) may be offered if endometriosis is the cause of her pain . She understands and wishes to proceed

## 2016-12-19 ENCOUNTER — Encounter
Admission: RE | Admit: 2016-12-19 | Discharge: 2016-12-19 | Disposition: A | Discharge status: Home / Self Care | Payer: Medicaid Other | Source: Ambulatory Visit | Attending: Obstetrics and Gynecology | Admitting: Obstetrics and Gynecology

## 2016-12-19 DIAGNOSIS — Z01812 Encounter for preprocedural laboratory examination: Secondary | ICD-10-CM | POA: Diagnosis present

## 2016-12-19 HISTORY — DX: Chronic obstructive pulmonary disease, unspecified: J44.9

## 2016-12-19 HISTORY — DX: Gastro-esophageal reflux disease without esophagitis: K21.9

## 2016-12-19 HISTORY — DX: Unspecified convulsions: R56.9

## 2016-12-19 HISTORY — DX: Bipolar disorder, unspecified: F31.9

## 2016-12-19 HISTORY — DX: Headache: R51

## 2016-12-19 HISTORY — DX: Unspecified asthma, uncomplicated: J45.909

## 2016-12-19 HISTORY — DX: Headache, unspecified: R51.9

## 2016-12-19 HISTORY — DX: Dyspnea, unspecified: R06.00

## 2016-12-19 LAB — CBC
HEMATOCRIT: 42 % (ref 35.0–47.0)
HEMOGLOBIN: 13.9 g/dL (ref 12.0–16.0)
MCH: 25.7 pg — ABNORMAL LOW (ref 26.0–34.0)
MCHC: 33 g/dL (ref 32.0–36.0)
MCV: 77.9 fL — ABNORMAL LOW (ref 80.0–100.0)
Platelets: 258 10*3/uL (ref 150–440)
RBC: 5.4 MIL/uL — AB (ref 3.80–5.20)
RDW: 14.4 % (ref 11.5–14.5)
WBC: 7.5 10*3/uL (ref 3.6–11.0)

## 2016-12-19 LAB — BASIC METABOLIC PANEL
ANION GAP: 8 (ref 5–15)
BUN: 10 mg/dL (ref 6–20)
CALCIUM: 9.2 mg/dL (ref 8.9–10.3)
CO2: 25 mmol/L (ref 22–32)
Chloride: 106 mmol/L (ref 101–111)
Creatinine, Ser: 0.71 mg/dL (ref 0.44–1.00)
GLUCOSE: 71 mg/dL (ref 65–99)
POTASSIUM: 3.9 mmol/L (ref 3.5–5.1)
Sodium: 139 mmol/L (ref 135–145)

## 2016-12-19 LAB — TYPE AND SCREEN
ABO/RH(D): O POS
ANTIBODY SCREEN: NEGATIVE

## 2016-12-19 NOTE — Patient Instructions (Signed)
Your procedure is scheduled on: December 23, 2016 (FRIDAY) Report to Same Day Surgery 2nd floor medical mall (Medical Mall Entrance-take elevator on left to 2nd floor.  Check in with surgery information desk.) To find out your arrival time please call [phone] between 1PM - 3PM on December 22, 2016 (THURSDAY)  Remember: Instructions that are not followed completely may result in serious medical risk, up to and including death, or upon the discretion of your surgeon and anesthesiologist your surgery may need to be rescheduled.    _x___ 1. Do not eat food or drink liquids after midnight. No gum chewing or hard candies                              __x__ 2. No Alcohol for 24 hours before or after surgery.   __x__3. No Smoking for 24 prior to surgery.   ____  4. Bring all medications with you on the day of surgery if instructed.    __x__ 5. Notify your doctor if there is any change in your medical condition     (cold, fever, infections).     Do not wear jewelry, make-up, hairpins, clips or nail polish.  Do not wear lotions, powders, or perfumes.   Do not shave 48 hours prior to surgery. Men may shave face and neck.  Do not bring valuables to the hospital.    [facility] not responsible for any belongings or valuables.               Contacts, dentures or bridgework may not be worn into surgery.  Leave your suitcase in the car. After surgery it may be brought to your room.  For patients admitted to the hospital, discharge time is determined by your treatment team                   Patients discharged the day of surgery will not be allowed to drive home.  You will need someone to drive you home and stay with you the night of your procedure.    Please read over the following fact sheets that you were given:   [facility] Preparing for Surgery and or MRSA Information   TAKE THE FOLLOWING MEDICATION THE MORNING OF SURGERY WITH A SIP OF WATER  :  1. OMEPRAZOLE (OMEPRAZOLE AT BEDTIME ON  AUGUST 9 )  2.  3.  4.  5.  6.  _x___Fleets enema or Magnesium Citrate as directed. (FLEET ENEMA EARLY MORNING OF SURGERY PRIOR TO COMING TO HOSPITAL )  _x___ Use CHG Soap or sage wipes as directed on instruction sheet   _x__ Use inhalers on the day of surgery and bring to hospital day of surgery (USE PROAIR INHALER THE MORNING OF SURGERY AS USUAL AND BRING TO HOSPITAL )  ____ Stop Metformin and Janumet 2 days prior to surgery.    ____ Take 1/2 of usual insulin dose the night before surgery and none on the morning surgery   _x___ Follow recommendations from Cardiologist, Pulmonologist or PCP regarding          stopping Aspirin, Coumadin, Plavix ,Eliquis, Effient, or Pradaxa, and Pletal.  X____Stop Anti-inflammatories such as Advil, Aleve, Ibuprofen, Motrin, Naproxen, Naprosyn, Goodies powders or aspirin products. OK to take Tylenol    _x___ Stop supplements until after surgery.  But may continue Vitamin D, Vitamin B, and multivitamin       ____ Bring C-Pap to the  hospital.

## 2016-12-22 MED ORDER — CEFOXITIN SODIUM-DEXTROSE 2-2.2 GM-% IV SOLR (PREMIX): 2.0000 g | INTRAVENOUS | Status: DC

## 2016-12-22 MED ORDER — CEFOXITIN SODIUM-DEXTROSE 2-2.2 GM-% IV SOLR (PREMIX)
2.0000 g | INTRAVENOUS | Status: AC
  Administered 2016-12-23: 2 g via INTRAVENOUS

## 2016-12-23 ENCOUNTER — Encounter: Payer: Self-pay | Admitting: *Deleted

## 2016-12-23 ENCOUNTER — Observation Stay
Admission: RE | Admit: 2016-12-23 | Discharge: 2016-12-24 | Disposition: A | Discharge status: Home / Self Care | Payer: Medicaid Other | Source: Ambulatory Visit | Attending: Obstetrics and Gynecology | Admitting: Obstetrics and Gynecology

## 2016-12-23 ENCOUNTER — Encounter
Admission: RE | Disposition: A | Discharge status: Home / Self Care | Payer: Self-pay | Source: Ambulatory Visit | Attending: Obstetrics and Gynecology

## 2016-12-23 ENCOUNTER — Ambulatory Visit: Payer: Medicaid Other | Admitting: Anesthesiology

## 2016-12-23 DIAGNOSIS — F17210 Nicotine dependence, cigarettes, uncomplicated: Secondary | ICD-10-CM | POA: Insufficient documentation

## 2016-12-23 DIAGNOSIS — Z79899 Other long term (current) drug therapy: Secondary | ICD-10-CM | POA: Insufficient documentation

## 2016-12-23 DIAGNOSIS — N920 Excessive and frequent menstruation with regular cycle: Secondary | ICD-10-CM | POA: Diagnosis not present

## 2016-12-23 DIAGNOSIS — K219 Gastro-esophageal reflux disease without esophagitis: Secondary | ICD-10-CM | POA: Diagnosis not present

## 2016-12-23 DIAGNOSIS — G8929 Other chronic pain: Secondary | ICD-10-CM | POA: Diagnosis not present

## 2016-12-23 DIAGNOSIS — R102 Pelvic and perineal pain: Secondary | ICD-10-CM | POA: Insufficient documentation

## 2016-12-23 DIAGNOSIS — Z9889 Other specified postprocedural states: Secondary | ICD-10-CM

## 2016-12-23 DIAGNOSIS — F339 Major depressive disorder, recurrent, unspecified: Secondary | ICD-10-CM | POA: Insufficient documentation

## 2016-12-23 DIAGNOSIS — J45909 Unspecified asthma, uncomplicated: Secondary | ICD-10-CM | POA: Insufficient documentation

## 2016-12-23 HISTORY — PX: LAPAROSCOPIC ASSISTED VAGINAL HYSTERECTOMY: SHX5398

## 2016-12-23 HISTORY — PX: LAPAROSCOPIC UNILATERAL SALPINGO OOPHERECTOMY: SHX5935

## 2016-12-23 HISTORY — PX: LAPAROSCOPIC UNILATERAL SALPINGECTOMY: SHX5934

## 2016-12-23 LAB — URINE DRUG SCREEN, QUALITATIVE (ARMC ONLY)
Amphetamines, Ur Screen: NOT DETECTED
BARBITURATES, UR SCREEN: NOT DETECTED
Benzodiazepine, Ur Scrn: NOT DETECTED
COCAINE METABOLITE, UR ~~LOC~~: NOT DETECTED
Cannabinoid 50 Ng, Ur ~~LOC~~: POSITIVE — AB
MDMA (Ecstasy)Ur Screen: NOT DETECTED
METHADONE SCREEN, URINE: NOT DETECTED
OPIATE, UR SCREEN: NOT DETECTED
Phencyclidine (PCP) Ur S: NOT DETECTED
Tricyclic, Ur Screen: NOT DETECTED

## 2016-12-23 LAB — PREGNANCY, URINE: Preg Test, Ur: NEGATIVE

## 2016-12-23 LAB — POCT PREGNANCY, URINE: PREG TEST UR: NEGATIVE

## 2016-12-23 SURGERY — HYSTERECTOMY, VAGINAL, LAPAROSCOPY-ASSISTED
Anesthesia: General | Laterality: Right

## 2016-12-23 MED ORDER — MONTELUKAST SODIUM 10 MG PO TABS
10.0000 mg | ORAL_TABLET | Freq: Every day | ORAL | Status: DC
  Filled 2016-12-23: qty 1

## 2016-12-23 MED ORDER — FLEET ENEMA 7-19 GM/118ML RE ENEM: 1.0000 | ENEMA | Freq: Once | RECTAL | Status: DC

## 2016-12-23 MED ORDER — PROPOFOL 10 MG/ML IV BOLUS
INTRAVENOUS | Status: AC
  Filled 2016-12-23: qty 20

## 2016-12-23 MED ORDER — PANTOPRAZOLE SODIUM 40 MG PO TBEC
40.0000 mg | DELAYED_RELEASE_TABLET | Freq: Every day | ORAL | Status: DC
  Administered 2016-12-23 – 2016-12-24 (×2): 40 mg via ORAL
  Filled 2016-12-23 (×2): qty 1

## 2016-12-23 MED ORDER — ONDANSETRON HCL 4 MG PO TABS: 4.0000 mg | ORAL_TABLET | Freq: Four times a day (QID) | ORAL | Status: DC | PRN

## 2016-12-23 MED ORDER — ROCURONIUM BROMIDE 100 MG/10ML IV SOLN
INTRAVENOUS | Status: DC | PRN
  Administered 2016-12-23: 40 mg via INTRAVENOUS
  Administered 2016-12-23: 20 mg via INTRAVENOUS
  Administered 2016-12-23: 10 mg via INTRAVENOUS

## 2016-12-23 MED ORDER — KETOROLAC TROMETHAMINE 30 MG/ML IJ SOLN
INTRAMUSCULAR | Status: DC | PRN
  Administered 2016-12-23: 30 mg via INTRAVENOUS

## 2016-12-23 MED ORDER — MORPHINE SULFATE (PF) 2 MG/ML IV SOLN
1.0000 mg | INTRAVENOUS | Status: DC | PRN
  Administered 2016-12-23: 1 mg via INTRAVENOUS
  Administered 2016-12-23: 2 mg via INTRAVENOUS
  Filled 2016-12-23 (×2): qty 1

## 2016-12-23 MED ORDER — SUGAMMADEX SODIUM 200 MG/2ML IV SOLN
INTRAVENOUS | Status: DC | PRN
  Administered 2016-12-23: 170 mg via INTRAVENOUS

## 2016-12-23 MED ORDER — ACETAMINOPHEN 10 MG/ML IV SOLN
INTRAVENOUS | Status: DC | PRN
  Administered 2016-12-23: 1000 mg via INTRAVENOUS

## 2016-12-23 MED ORDER — LACTATED RINGERS IV SOLN
INTRAVENOUS | Status: DC
  Administered 2016-12-23: 07:00:00 via INTRAVENOUS

## 2016-12-23 MED ORDER — SOD CITRATE-CITRIC ACID 500-334 MG/5ML PO SOLN
30.0000 mL | ORAL | Status: DC
  Filled 2016-12-23: qty 30

## 2016-12-23 MED ORDER — FENTANYL CITRATE (PF) 100 MCG/2ML IJ SOLN
INTRAMUSCULAR | Status: AC
  Administered 2016-12-23: 25 ug via INTRAVENOUS
  Filled 2016-12-23: qty 2

## 2016-12-23 MED ORDER — IBUPROFEN 800 MG PO TABS
800.0000 mg | ORAL_TABLET | Freq: Three times a day (TID) | ORAL | Status: DC | PRN
  Administered 2016-12-23 – 2016-12-24 (×2): 800 mg via ORAL
  Filled 2016-12-23 (×2): qty 1

## 2016-12-23 MED ORDER — LIDOCAINE HCL (CARDIAC) 20 MG/ML IV SOLN
INTRAVENOUS | Status: DC | PRN
  Administered 2016-12-23: 50 mg via INTRAVENOUS

## 2016-12-23 MED ORDER — IPRATROPIUM-ALBUTEROL 0.5-2.5 (3) MG/3ML IN SOLN
RESPIRATORY_TRACT | Status: AC
  Filled 2016-12-23: qty 3

## 2016-12-23 MED ORDER — DEXAMETHASONE SODIUM PHOSPHATE 10 MG/ML IJ SOLN
INTRAMUSCULAR | Status: DC | PRN
  Administered 2016-12-23: 10 mg via INTRAVENOUS

## 2016-12-23 MED ORDER — PROMETHAZINE HCL 25 MG/ML IJ SOLN
12.5000 mg | Freq: Once | INTRAMUSCULAR | Status: AC
  Administered 2016-12-23: 12.5 mg via INTRAVENOUS

## 2016-12-23 MED ORDER — FLUTICASONE PROPIONATE 50 MCG/ACT NA SUSP
1.0000 | Freq: Two times a day (BID) | NASAL | Status: DC
  Administered 2016-12-23 – 2016-12-24 (×2): 1 via NASAL
  Filled 2016-12-23: qty 16

## 2016-12-23 MED ORDER — ONDANSETRON HCL 4 MG/2ML IJ SOLN
INTRAMUSCULAR | Status: AC
  Filled 2016-12-23: qty 2

## 2016-12-23 MED ORDER — SIMETHICONE 80 MG PO CHEW
80.0000 mg | CHEWABLE_TABLET | Freq: Four times a day (QID) | ORAL | Status: DC | PRN
  Administered 2016-12-23 – 2016-12-24 (×2): 80 mg via ORAL
  Filled 2016-12-23 (×2): qty 1

## 2016-12-23 MED ORDER — IPRATROPIUM-ALBUTEROL 0.5-2.5 (3) MG/3ML IN SOLN
3.0000 mL | Freq: Once | RESPIRATORY_TRACT | Status: AC
  Administered 2016-12-23: 3 mL via RESPIRATORY_TRACT

## 2016-12-23 MED ORDER — SODIUM CHLORIDE 0.9 % IJ SOLN
INTRAMUSCULAR | Status: AC
  Filled 2016-12-23: qty 10

## 2016-12-23 MED ORDER — FENTANYL CITRATE (PF) 250 MCG/5ML IJ SOLN
INTRAMUSCULAR | Status: AC
  Filled 2016-12-23: qty 5

## 2016-12-23 MED ORDER — OXYCODONE-ACETAMINOPHEN 5-325 MG PO TABS
1.0000 | ORAL_TABLET | ORAL | Status: DC | PRN
  Administered 2016-12-23 – 2016-12-24 (×4): 2 via ORAL
  Filled 2016-12-23 (×4): qty 2

## 2016-12-23 MED ORDER — ONDANSETRON HCL 4 MG/2ML IJ SOLN
INTRAMUSCULAR | Status: DC | PRN
  Administered 2016-12-23: 4 mg via INTRAVENOUS

## 2016-12-23 MED ORDER — LACTATED RINGERS IV SOLN: INTRAVENOUS | Status: DC

## 2016-12-23 MED ORDER — ROCURONIUM BROMIDE 50 MG/5ML IV SOLN
INTRAVENOUS | Status: AC
  Filled 2016-12-23: qty 1

## 2016-12-23 MED ORDER — OXYCODONE HCL 5 MG PO TABS: 5.0000 mg | ORAL_TABLET | Freq: Once | ORAL | Status: DC | PRN

## 2016-12-23 MED ORDER — OXYCODONE HCL 5 MG/5ML PO SOLN: 5.0000 mg | Freq: Once | ORAL | Status: DC | PRN

## 2016-12-23 MED ORDER — LIDOCAINE-EPINEPHRINE 1 %-1:100000 IJ SOLN
INTRAMUSCULAR | Status: AC
  Filled 2016-12-23: qty 1

## 2016-12-23 MED ORDER — LIDOCAINE HCL (PF) 2 % IJ SOLN
INTRAMUSCULAR | Status: AC
  Filled 2016-12-23: qty 2

## 2016-12-23 MED ORDER — LACTATED RINGERS IV SOLN
INTRAVENOUS | Status: DC
  Administered 2016-12-23 (×2): via INTRAVENOUS

## 2016-12-23 MED ORDER — PHENYLEPHRINE HCL 10 MG/ML IJ SOLN
INTRAMUSCULAR | Status: DC | PRN
  Administered 2016-12-23: 50 ug via INTRAVENOUS
  Administered 2016-12-23: 100 ug via INTRAVENOUS

## 2016-12-23 MED ORDER — PROPOFOL 10 MG/ML IV BOLUS
INTRAVENOUS | Status: DC | PRN
  Administered 2016-12-23: 150 mg via INTRAVENOUS

## 2016-12-23 MED ORDER — DEXAMETHASONE SODIUM PHOSPHATE 10 MG/ML IJ SOLN
INTRAMUSCULAR | Status: AC
  Filled 2016-12-23: qty 1

## 2016-12-23 MED ORDER — DOCUSATE SODIUM 100 MG PO CAPS
100.0000 mg | ORAL_CAPSULE | Freq: Every day | ORAL | Status: DC
  Administered 2016-12-23: 100 mg via ORAL
  Filled 2016-12-23: qty 1

## 2016-12-23 MED ORDER — FENTANYL CITRATE (PF) 100 MCG/2ML IJ SOLN
INTRAMUSCULAR | Status: DC | PRN
  Administered 2016-12-23: 150 ug via INTRAVENOUS
  Administered 2016-12-23 (×2): 50 ug via INTRAVENOUS

## 2016-12-23 MED ORDER — PROMETHAZINE HCL 25 MG/ML IJ SOLN
INTRAMUSCULAR | Status: AC
  Administered 2016-12-23: 12.5 mg via INTRAVENOUS
  Filled 2016-12-23: qty 1

## 2016-12-23 MED ORDER — MIDAZOLAM HCL 2 MG/2ML IJ SOLN
INTRAMUSCULAR | Status: AC
  Filled 2016-12-23: qty 2

## 2016-12-23 MED ORDER — BUPIVACAINE HCL 0.5 % IJ SOLN
INTRAMUSCULAR | Status: DC | PRN
  Administered 2016-12-23: 12 mL

## 2016-12-23 MED ORDER — ZOLPIDEM TARTRATE 5 MG PO TABS: 5.0000 mg | ORAL_TABLET | Freq: Every evening | ORAL | Status: DC | PRN

## 2016-12-23 MED ORDER — ONDANSETRON HCL 4 MG/2ML IJ SOLN: 4.0000 mg | Freq: Four times a day (QID) | INTRAMUSCULAR | Status: DC | PRN

## 2016-12-23 MED ORDER — MIDAZOLAM HCL 2 MG/2ML IJ SOLN
INTRAMUSCULAR | Status: DC | PRN
  Administered 2016-12-23: 2 mg via INTRAVENOUS

## 2016-12-23 MED ORDER — CEFOXITIN SODIUM-DEXTROSE 2-2.2 GM-% IV SOLR (PREMIX)
INTRAVENOUS | Status: AC
  Filled 2016-12-23: qty 50

## 2016-12-23 MED ORDER — KETOROLAC TROMETHAMINE 30 MG/ML IJ SOLN
INTRAMUSCULAR | Status: AC
  Filled 2016-12-23: qty 1

## 2016-12-23 MED ORDER — SUGAMMADEX SODIUM 200 MG/2ML IV SOLN
INTRAVENOUS | Status: AC
  Filled 2016-12-23: qty 2

## 2016-12-23 MED ORDER — BUPIVACAINE HCL (PF) 0.5 % IJ SOLN
INTRAMUSCULAR | Status: AC
  Filled 2016-12-23: qty 30

## 2016-12-23 MED ORDER — ACETAMINOPHEN NICU IV SYRINGE 10 MG/ML
INTRAVENOUS | Status: AC
  Filled 2016-12-23: qty 1

## 2016-12-23 MED ORDER — MONTELUKAST SODIUM 10 MG PO TABS
10.0000 mg | ORAL_TABLET | Freq: Every day | ORAL | Status: DC
  Administered 2016-12-23: 10 mg via ORAL
  Filled 2016-12-23: qty 1

## 2016-12-23 MED ORDER — LIDOCAINE-EPINEPHRINE 1 %-1:100000 IJ SOLN
INTRAMUSCULAR | Status: DC | PRN
Start: 2016-12-23 — End: 2016-12-23
  Administered 2016-12-23: 9 mL

## 2016-12-23 MED ORDER — FENTANYL CITRATE (PF) 100 MCG/2ML IJ SOLN
25.0000 ug | INTRAMUSCULAR | Status: DC | PRN
  Administered 2016-12-23 (×4): 25 ug via INTRAVENOUS

## 2016-12-23 SURGICAL SUPPLY — 62 items
BAG URO DRAIN 2000ML W/SPOUT (MISCELLANEOUS) ×5 IMPLANT
BLADE SURG SZ11 CARB STEEL (BLADE) ×5 IMPLANT
CANISTER SUCT 1200ML W/VALVE (MISCELLANEOUS) ×5 IMPLANT
CATH FOLEY 2WAY  5CC 16FR (CATHETERS) ×2
CATH ROBINSON RED A/P 16FR (CATHETERS) ×5 IMPLANT
CATH URTH 16FR FL 2W BLN LF (CATHETERS) ×3 IMPLANT
CHLORAPREP W/TINT 26ML (MISCELLANEOUS) ×5 IMPLANT
CLOSURE WOUND 1/2 X4 (GAUZE/BANDAGES/DRESSINGS) ×1
CLOSURE WOUND 1/4X4 (GAUZE/BANDAGES/DRESSINGS) ×1
DERMABOND ADVANCED (GAUZE/BANDAGES/DRESSINGS) ×2
DERMABOND ADVANCED .7 DNX12 (GAUZE/BANDAGES/DRESSINGS) ×3 IMPLANT
DEVICE PMI PUNCTURE CLOSURE (MISCELLANEOUS) ×5 IMPLANT
DRAPE SURG 17X11 SM STRL (DRAPES) ×5 IMPLANT
DRSG TEGADERM 2-3/8X2-3/4 SM (GAUZE/BANDAGES/DRESSINGS) ×5 IMPLANT
DRSG TEGADERM 2X2.25 PEDS (GAUZE/BANDAGES/DRESSINGS) ×5 IMPLANT
ELECT REM PT RETURN 9FT ADLT (ELECTROSURGICAL) ×5
ELECTRODE REM PT RTRN 9FT ADLT (ELECTROSURGICAL) ×3 IMPLANT
FILTER LAP SMOKE EVAC STRL (MISCELLANEOUS) ×5 IMPLANT
GAUZE SPONGE NON-WVN 2X2 STRL (MISCELLANEOUS) ×3 IMPLANT
GLOVE BIO SURGEON STRL SZ8 (GLOVE) ×20 IMPLANT
GOWN STRL REUS W/ TWL LRG LVL3 (GOWN DISPOSABLE) ×9 IMPLANT
GOWN STRL REUS W/ TWL XL LVL3 (GOWN DISPOSABLE) ×9 IMPLANT
GOWN STRL REUS W/TWL LRG LVL3 (GOWN DISPOSABLE) ×6
GOWN STRL REUS W/TWL XL LVL3 (GOWN DISPOSABLE) ×6
GRASPER SUT TROCAR 14GX15 (MISCELLANEOUS) ×10 IMPLANT
HANDLE YANKAUER SUCT BULB TIP (MISCELLANEOUS) ×5 IMPLANT
IRRIGATION STRYKERFLOW (MISCELLANEOUS) IMPLANT
IRRIGATOR STRYKERFLOW (MISCELLANEOUS)
IV NS 1000ML (IV SOLUTION) ×2
IV NS 1000ML BAXH (IV SOLUTION) ×3 IMPLANT
KIT PINK PAD W/HEAD ARE REST (MISCELLANEOUS) ×5
KIT PINK PAD W/HEAD ARM REST (MISCELLANEOUS) ×3 IMPLANT
KIT RM TURNOVER CYSTO AR (KITS) ×5 IMPLANT
LABEL OR SOLS (LABEL) ×5 IMPLANT
NS IRRIG 500ML POUR BTL (IV SOLUTION) ×5 IMPLANT
PACK BASIN MINOR ARMC (MISCELLANEOUS) ×5 IMPLANT
PACK GYN LAPAROSCOPIC (MISCELLANEOUS) ×5 IMPLANT
PAD OB MATERNITY 4.3X12.25 (PERSONAL CARE ITEMS) ×5 IMPLANT
PAD PREP 24X41 OB/GYN DISP (PERSONAL CARE ITEMS) ×5 IMPLANT
POUCH SPECIMEN RETRIEVAL 10MM (ENDOMECHANICALS) IMPLANT
SCISSORS METZENBAUM CVD 33 (INSTRUMENTS) IMPLANT
SHEARS HARMONIC ACE PLUS 36CM (ENDOMECHANICALS) ×5 IMPLANT
SLEEVE ENDOPATH XCEL 5M (ENDOMECHANICALS) ×5 IMPLANT
SPONGE VERSALON 2X2 STRL (MISCELLANEOUS) ×2
SPONGE XRAY 4X4 16PLY STRL (MISCELLANEOUS) IMPLANT
STRIP CLOSURE SKIN 1/2X4 (GAUZE/BANDAGES/DRESSINGS) ×4 IMPLANT
STRIP CLOSURE SKIN 1/4X4 (GAUZE/BANDAGES/DRESSINGS) ×4 IMPLANT
SUT VIC AB 0 CT1 27 (SUTURE) ×4
SUT VIC AB 0 CT1 27XCR 8 STRN (SUTURE) ×6 IMPLANT
SUT VIC AB 0 CT1 36 (SUTURE) ×10 IMPLANT
SUT VIC AB 0 CT2 27 (SUTURE) ×5 IMPLANT
SUT VIC AB 2-0 UR6 27 (SUTURE) IMPLANT
SUT VIC AB 4-0 SH 27 (SUTURE) ×2
SUT VIC AB 4-0 SH 27XANBCTRL (SUTURE) ×3 IMPLANT
SWABSTK COMLB BENZOIN TINCTURE (MISCELLANEOUS) ×5 IMPLANT
SYR CONTROL 10ML (SYRINGE) ×5 IMPLANT
SYRINGE 10CC LL (SYRINGE) ×5 IMPLANT
TROCAR ENDO BLADELESS 11MM (ENDOMECHANICALS) ×5 IMPLANT
TROCAR XCEL NON-BLD 5MMX100MML (ENDOMECHANICALS) ×5 IMPLANT
TROCAR XCEL UNIV SLVE 11M 100M (ENDOMECHANICALS) IMPLANT
TUBING INSUF HEATED (TUBING) ×5 IMPLANT
TUBING INSUFFLATOR HI FLOW (MISCELLANEOUS) ×5 IMPLANT

## 2016-12-23 NOTE — Transfer of Care (Signed)
Immediate Anesthesia Transfer of Care Note  Patient: Carrie Marshall  Procedure(s) Performed: Procedure(s): LAPAROSCOPIC ASSISTED VAGINAL HYSTERECTOMY (N/A) LAPAROSCOPIC UNILATERAL SALPINGO OOPHORECTOMY (Right) LAPAROSCOPIC UNILATERAL SALPINGECTOMY (Left)  Patient Location: PACU  Anesthesia Type:General  Level of Consciousness: sedated and patient cooperative  Airway & Oxygen Therapy: Patient Spontanous Breathing and Patient connected to face mask oxygen  Post-op Assessment: Report given to RN and Post -op Vital signs reviewed and stable  Post vital signs: Reviewed and stable  Last Vitals:  Vitals:   12/23/16 0620 12/23/16 1000  BP: 117/77 99/87  Pulse: 78 77  Resp: 16 17  Temp: 36.7 C 37 C  SpO2: 100% 99%    Last Pain:  Vitals:   12/23/16 0620  TempSrc: Oral  PainSc: 9          Complications: No apparent anesthesia complications

## 2016-12-23 NOTE — Anesthesia Procedure Notes (Signed)
Procedure Name: Intubation Date/Time: 12/23/2016 7:41 AM Performed by: Jonna Clark Pre-anesthesia Checklist: Patient identified, Patient being monitored, Timeout performed, Emergency Drugs available and Suction available Patient Re-evaluated:Patient Re-evaluated prior to induction Oxygen Delivery Method: Circle system utilized Preoxygenation: Pre-oxygenation with 100% oxygen Induction Type: IV induction Ventilation: Mask ventilation without difficulty Laryngoscope Size: Mac and 3 Grade View: Grade I Tube type: Oral Tube size: 7.0 mm Number of attempts: 1 Placement Confirmation: ETT inserted through vocal cords under direct vision,  positive ETCO2 and breath sounds checked- equal and bilateral Secured at: 21 cm Tube secured with: Tape Dental Injury: Teeth and Oropharynx as per pre-operative assessment

## 2016-12-23 NOTE — Anesthesia Postprocedure Evaluation (Signed)
Anesthesia Post Note  Patient: Carrie Marshall  Procedure(s) Performed: Procedure(s) (LRB): LAPAROSCOPIC ASSISTED VAGINAL HYSTERECTOMY (N/A) LAPAROSCOPIC UNILATERAL SALPINGO OOPHORECTOMY (Right) LAPAROSCOPIC UNILATERAL SALPINGECTOMY (Left)  Patient location during evaluation: PACU Anesthesia Type: General Level of consciousness: awake and alert Pain management: pain level controlled Vital Signs Assessment: post-procedure vital signs reviewed and stable Respiratory status: spontaneous breathing, nonlabored ventilation, respiratory function stable and patient connected to nasal cannula oxygen Cardiovascular status: blood pressure returned to baseline and stable Postop Assessment: no signs of nausea or vomiting Anesthetic complications: no     Last Vitals:  Vitals:   12/23/16 1157 12/23/16 1323  BP: 102/64 117/73  Pulse: (!) 49 (!) 54  Resp: 18 18  Temp: 36.4 C   SpO2: 100% 99%    Last Pain:  Vitals:   12/23/16 1322  TempSrc:   PainSc: 9                  Joseph K Piscitello

## 2016-12-23 NOTE — Anesthesia Post-op Follow-up Note (Signed)
Anesthesia QCDR form completed.        

## 2016-12-23 NOTE — Progress Notes (Signed)
Patient ID: Carrie Marshall, female   DOB: 08/08/1975, 41 y.o.   MRN: [id number] DOS  Pain in good control  Eating / drinking ok . Good urine output  VSS Stable  P; d/c foley saline lock IV

## 2016-12-23 NOTE — Anesthesia Preprocedure Evaluation (Signed)
Anesthesia Evaluation  Patient identified by MRN, date of birth, ID band Patient awake    Reviewed: Allergy & Precautions, H&P , NPO status , Patient's Chart, lab work & pertinent test results  History of Anesthesia Complications Negative for: history of anesthetic complications  Airway Mallampati: III  TM Distance: >3 FB Neck ROM: full    Dental  (+) Poor Dentition, Chipped   Pulmonary shortness of breath and with exertion, asthma , COPD, Current Smoker,           Cardiovascular Exercise Tolerance: Good (-) angina(-) Past MI negative cardio ROS       Neuro/Psych  Headaches, Seizures -, Well Controlled,  PSYCHIATRIC DISORDERS Anxiety Depression Bipolar Disorder    GI/Hepatic Neg liver ROS, GERD  Medicated and Controlled,  Endo/Other  negative endocrine ROS  Renal/GU      Musculoskeletal   Abdominal   Peds  Hematology negative hematology ROS (+)   Anesthesia Other Findings Past Medical History: No date: Anxiety No date: Asthma No date: Bipolar disorder (HCC) No date: COPD (chronic obstructive pulmonary disease) (HCC) No date: Depression No date: Dyspnea No date: GERD (gastroesophageal reflux disease) No date: Headache 2008: Seizures ([place])     Comment:  R/T to drugs  Past Surgical History: No date: APPENDECTOMY 03/24/2016: LAPAROSCOPIC LYSIS OF ADHESIONS     Comment:  Procedure: LAPAROSCOPIC LYSIS OF ADHESIONS;  Surgeon:               Boykin Nearing, MD;  Location: [facility]S;  Service:              Gynecology;; 03/24/2016: LAPAROSCOPY; N/A     Comment:  Procedure: LAPAROSCOPY DIAGNOSTIC;  Surgeon: Boykin Nearing, MD;  Location: [facility];  Service:               Gynecology;  Laterality: N/A; 03/24/2016: LAPAROSCOPY; N/A     Comment:  Procedure: LAPAROSCOPY OPERATIVE;  Surgeon: Boykin Nearing, MD;  Location: [facility]S;  Service:               Gynecology;   Laterality: N/A; No date: TUBAL LIGATION     Reproductive/Obstetrics negative OB ROS                             Anesthesia Physical Anesthesia Plan  ASA: III  Anesthesia Plan: General ETT   Post-op Pain Management:    Induction: Intravenous  PONV Risk Score and Plan: 4 or greater and Ondansetron, Dexamethasone, Midazolam and Treatment may vary due to age or medical condition  Airway Management Planned: Oral ETT  Additional Equipment:   Intra-op Plan:   Post-operative Plan: Extubation in OR and Possible Post-op intubation/ventilation  Informed Consent: I have reviewed the patients History and Physical, chart, labs and discussed the procedure including the risks, benefits and alternatives for the proposed anesthesia with the patient or authorized representative who has indicated his/her understanding and acceptance.   Dental Advisory Given  Plan Discussed with: Anesthesiologist, CRNA and Surgeon  Anesthesia Plan Comments: (Patient consented for risks of anesthesia including but not limited to:  - adverse reactions to medications - damage to teeth, lips or other oral mucosa - sore throat or hoarseness - Damage to heart, brain, lungs or loss of life  Patient voiced understanding.)  Anesthesia Quick Evaluation

## 2016-12-23 NOTE — Brief Op Note (Signed)
12/23/2016 DOS : 12/23/16 9:49 AM  PATIENT:  Carrie Marshall  41 y.o. female  PRE-OPERATIVE DIAGNOSIS:  Chronic Pelvic Pain, menorrhagia POST-OPERATIVE DIAGNOSIS:  Chronic Pelvic Pain, menorrhagia  PROCEDURE:  Procedure(s): LAPAROSCOPIC ASSISTED VAGINAL HYSTERECTOMY (N/A) LAPAROSCOPIC UNILATERAL SALPINGO OOPHORECTOMY (Right) LAPAROSCOPIC UNILATERAL SALPINGECTOMY (Left)  SURGEON:  Surgeon(s) and Role:    * Schermerhorn, Gwen Her, MD - Primary    * Benjaman Kindler, MD  PHYSICIAN ASSISTANT: Verdell Carmine PA student   ASSISTANTS: none   ANESTHESIA:   general  EBL:  Total I/O In: -  Out: 175 [Urine:100; Blood:75] IOF 750 CC  BLOOD ADMINISTERED:none  DRAINS: Urinary Catheter (Foley)   LOCAL MEDICATIONS USED:  LIDOCAINE  and Amount: 9 ml  SPECIMEN:  Source of Specimen:  cervix , uterus and right tube and ovary , left fallopian tube  DISPOSITION OF SPECIMEN:  PATHOLOGY  COUNTS:  YES  TOURNIQUET:  * No tourniquets in log *  DICTATION: .Other Dictation: Dictation Number verbal  PLAN OF CARE: Admit for overnight observation  PATIENT DISPOSITION:  PACU - hemodynamically stable.   Delay start of Pharmacological VTE agent (>24hrs) due to surgical blood loss or risk of bleeding: not applicable

## 2016-12-23 NOTE — Progress Notes (Signed)
Ready for LAVH / RSo and left salpingectomy  NPO  Labs reviewed   No questions .proceed

## 2016-12-24 DIAGNOSIS — N920 Excessive and frequent menstruation with regular cycle: Secondary | ICD-10-CM | POA: Diagnosis not present

## 2016-12-24 LAB — BASIC METABOLIC PANEL
Anion gap: 5 (ref 5–15)
BUN: 9 mg/dL (ref 6–20)
CHLORIDE: 107 mmol/L (ref 101–111)
CO2: 26 mmol/L (ref 22–32)
Calcium: 8.6 mg/dL — ABNORMAL LOW (ref 8.9–10.3)
Creatinine, Ser: 0.89 mg/dL (ref 0.44–1.00)
GFR calc Af Amer: >60 mL/min (ref >60)
GFR calc non Af Amer: >60 mL/min (ref >60)
Glucose, Bld: 143 mg/dL — ABNORMAL HIGH (ref 65–99)
POTASSIUM: 3.7 mmol/L (ref 3.5–5.1)
Sodium: 138 mmol/L (ref 135–145)

## 2016-12-24 LAB — CBC
HCT: 36.2 % (ref 35.0–47.0)
HEMOGLOBIN: 11.9 g/dL — AB (ref 12.0–16.0)
MCH: 25.5 pg — ABNORMAL LOW (ref 26.0–34.0)
MCHC: 32.8 g/dL (ref 32.0–36.0)
MCV: 77.7 fL — ABNORMAL LOW (ref 80.0–100.0)
Platelets: 236 10*3/uL (ref 150–440)
RBC: 4.66 MIL/uL (ref 3.80–5.20)
RDW: 14.8 % — ABNORMAL HIGH (ref 11.5–14.5)
WBC: 14.5 10*3/uL — ABNORMAL HIGH (ref 3.6–11.0)

## 2016-12-24 MED ORDER — DOCUSATE SODIUM 100 MG PO CAPS: 100.0000 mg | ORAL_CAPSULE | Freq: Every day | ORAL | 2 refills | Status: AC | PRN

## 2016-12-24 MED ORDER — OXYCODONE-ACETAMINOPHEN 5-325 MG PO TABS: 1.0000 | ORAL_TABLET | ORAL | 0 refills | Status: DC | PRN

## 2016-12-24 MED ORDER — ONDANSETRON HCL 4 MG PO TABS: 4.0000 mg | ORAL_TABLET | Freq: Four times a day (QID) | ORAL | 0 refills | Status: DC | PRN

## 2016-12-24 MED ORDER — IBUPROFEN 800 MG PO TABS: 800.0000 mg | ORAL_TABLET | Freq: Three times a day (TID) | ORAL | 0 refills | Status: DC | PRN

## 2016-12-24 NOTE — Progress Notes (Signed)
D/C instructions provided, pt states understanding, aware of follow up appt.  Prescription given to pt. Pt to car via wheelchair by RN.

## 2016-12-24 NOTE — Discharge Summary (Signed)
Physician Discharge Summary  Patient ID: Carrie Marshall MRN: [id number] DOB/AGE: 1976-04-30 41 y.o.  Admit date: 12/23/2016 Discharge date: 12/24/2016  Admission Diagnoses:menorrhagia , chronic pelvic pain  Discharge Diagnoses: same  Active Problems:   Post-operative state   Discharged Condition: good  Hospital Course: uncomplicated LAVH and RSO and left salpingectomy   Consults: None  Significant Diagnostic Studies: labs:  Results for orders placed or performed during the hospital encounter of 12/23/16 (from the past 24 hour(s))  CBC     Status: Abnormal   Collection Time: 12/24/16  5:33 AM  Result Value Ref Range   WBC 14.5 (H) 3.6 - 11.0 K/uL   RBC 4.66 3.80 - 5.20 MIL/uL   Hemoglobin 11.9 (L) 12.0 - 16.0 g/dL   HCT 36.2 35.0 - 47.0 %   MCV 77.7 (L) 80.0 - 100.0 fL   MCH 25.5 (L) 26.0 - 34.0 pg   MCHC 32.8 32.0 - 36.0 g/dL   RDW 14.8 (H) 11.5 - 14.5 %   Platelets 236 150 - 440 K/uL  Basic metabolic panel     Status: Abnormal   Collection Time: 12/24/16  5:33 AM  Result Value Ref Range   Sodium 138 135 - 145 mmol/L   Potassium 3.7 3.5 - 5.1 mmol/L   Chloride 107 101 - 111 mmol/L   CO2 26 22 - 32 mmol/L   Glucose, Bld 143 (H) 65 - 99 mg/dL   BUN 9 6 - 20 mg/dL   Creatinine, Ser 0.89 0.44 - 1.00 mg/dL   Calcium 8.6 (L) 8.9 - 10.3 mg/dL   GFR calc non Af Amer >60 >60 mL/min   GFR calc Af Amer >60 >60 mL/min   Anion gap 5 5 - 15    Treatments: surgery: as above   Discharge Exam: Blood pressure (!) 96/56, pulse (!) 56, temperature 97.8 F (36.6 C), temperature source Oral, resp. rate 20, height 5' 7"  (1.702 m), weight 86.6 kg (191 lb), last menstrual period 12/18/2016, SpO2 98 %. General appearance: alert and cooperative Head: Normocephalic, without obvious abnormality, atraumatic Resp: clear to auscultation bilaterally Cardio: regular rate and rhythm, S1, S2 normal, no murmur, click, rub or gallop GI: soft, non-tender; bowel sounds normal; no masses,  no  organomegaly Incision/Wound:incisions intact , dry   Disposition: 01-Home or Self Care  Discharge Instructions    Call MD for:    Complete by:  As directed    Heavy vaginal bleeding   Call MD for:  difficulty breathing, headache or visual disturbances    Complete by:  As directed    Call MD for:  extreme fatigue    Complete by:  As directed    Call MD for:  hives    Complete by:  As directed    Call MD for:  persistant dizziness or light-headedness    Complete by:  As directed    Call MD for:  persistant nausea and vomiting    Complete by:  As directed    Call MD for:  redness, tenderness, or signs of infection (pain, swelling, redness, odor or green/yellow discharge around incision site)    Complete by:  As directed    Call MD for:  severe uncontrolled pain    Complete by:  As directed    Call MD for:  temperature >100.4    Complete by:  As directed    Diet - low sodium heart healthy    Complete by:  As directed    Increase activity slowly  Complete by:  As directed      Allergies as of 12/24/2016      Reactions   Gabapentin Shortness Of Breath   Chest pain   Chantix [varenicline] Nausea And Vomiting, Other (See Comments)   Crazy dreams      Medication List    STOP taking these medications   doxycycline 100 MG capsule Commonly known as:  VIBRAMYCIN   metroNIDAZOLE 500 MG tablet Commonly known as:  FLAGYL   promethazine 25 MG tablet Commonly known as:  PHENERGAN     TAKE these medications   acetaminophen 500 MG tablet Commonly known as:  TYLENOL Take 1,000 mg by mouth every 6 (six) hours as needed for moderate pain or headache.   docusate sodium 100 MG capsule Commonly known as:  COLACE Take 1 capsule (100 mg total) by mouth daily as needed.   fluticasone 50 MCG/ACT nasal spray Commonly known as:  FLONASE Place 1 spray into both nostrils 2 (two) times daily.   ibuprofen 800 MG tablet Commonly known as:  ADVIL,MOTRIN Take 1 tablet (800 mg total) by  mouth every 8 (eight) hours as needed (mild pain).   montelukast 10 MG tablet Commonly known as:  SINGULAIR Take 10 mg by mouth at bedtime.   omeprazole 40 MG capsule Commonly known as:  PRILOSEC Take 40 mg by mouth daily.   ondansetron 4 MG tablet Commonly known as:  ZOFRAN Take 1 tablet (4 mg total) by mouth every 6 (six) hours as needed for nausea. What changed:  when to take this  reasons to take this   oxyCODONE-acetaminophen 5-325 MG tablet Commonly known as:  PERCOCET/ROXICET Take 1 tablet by mouth every 4 (four) hours as needed (moderate to severe pain (when tolerating fluids)). What changed:  how much to take  when to take this  reasons to take this   PROAIR HFA 108 (90 Base) MCG/ACT inhaler Generic drug:  albuterol INHALE 2 PUFFS INTO THE LUNGS EVERY 4-6 HOURS AS NEEDED FOR WHEEZING.      Follow-up Information    Avelina Mcclurkin, Gwen Her, MD Follow up in 2 week(s).   Specialty:  Obstetrics and Gynecology Why:  post op  Contact information: [address] [place] [address] [phone]           Signed: Gwen Her Ein Rijo 12/24/2016, 9:33 AM

## 2016-12-26 NOTE — Op Note (Signed)
NAME:  Carrie Marshall, Carrie Marshall               ACCOUNT NO.:  [id number]  MEDICAL RECORD NO.:  [id number]  LOCATION:                                 FACILITY:  PHYSICIAN:  Laverta Baltimore, MD     DATE OF BIRTH:  DATE OF PROCEDURE:  12/23/2016 DATE OF DISCHARGE:                              OPERATIVE REPORT   PREOPERATIVE DIAGNOSIS: 1. Menorrhagia. 2. Chronic pelvic pain.  POSTOPERATIVE DIAGNOSIS: 1. Menorrhagia. 2. Chronic pelvic pain.  PROCEDURE PERFORMED: 1. Laparoscopic-assisted vaginal hysterectomy. 2. Right salpingo-oophorectomy, left salpingectomy.  SURGEON:  Laverta Baltimore, MD  ANESTHESIA:  General endotracheal anesthesia.  SURGEON:  Laverta Baltimore, MD.  FIRST ASSISTANT:  Leafy Ro.  SECOND ASSISTANT:  Carmin Muskrat, PA student.  INDICATION:  A 41 year old gravida 6, para 6 patient, underwent a diagnostic laparoscopy in November 2017 for chronic pelvic pain.  Biopsy showed endosalpingiosis.  The patient was tried on Depot Lupron and continues birth control pills to handle her chronic pelvic pain, which she got a little benefit from.  The patient desires definitive surgery. Pain is worse on the right, therefore she elects to have the right tube and ovary removed.  DESCRIPTION OF PROCEDURE:  After adequate general endotracheal anesthesia, the patient was placed in dorsal supine position.  The patient's abdomen, perineum, and vagina were prepped and draped in normal sterile fashion.  She did receive 2 g IV cefoxitin and prior to commencement of the case.  A time-out was performed.  An infraumbilical incision was made after injecting with 0.5% Marcaine.  The laparoscope was advanced into the abdominal cavity with the Optiview cannula.  Once gaining entrance into the peritoneal cavity, the patient's abdomen was insufflated with carbon dioxide.  Second port site was placed left lower quadrant, 3 cm medial to the left anterior iliac spine.  A 5 mm port was advanced  under direct visualization.  Similar port was placed in the right lower quadrant again 3 cm medial to the right anterior iliac spine.  The patient was placed in Trendelenburg.  Attention was directed to the patient's right infundibulopelvic ligament was identified, cauterized with the Kleppinger's, and transected with harmonic scalpel. The right ovary and fallopian tube were dissected from the broad ligament and remained attached to the uterus.  The ureters were identified bilaterally, which showed normal peristaltic activity prior to the excision of the infundibulopelvic ligament.  The round ligament on the right was transected and opened and the right uterine artery was skeletonized and cauterized and transected with harmonic scalpel. Bladder flap was created sharply with the harmonic scalpel.  Similar procedures repeated on the left side.  The left fallopian tube only was dissected free from the left ovary.  Broad ligament was incised.  Round ligament was transected and left uterine artery was skeletonized, cauterized, and transected.  At this point, surgeons moved to the perineal area, and weighted speculum was placed in the posterior vaginal vault and the anterior cervix was grasped with a thyroid tenacula as well as a posterior lip of the cervix.  Cervix was circumferentially injected with 1% lidocaine with 1:100,000 epinephrine.  A direct posterior colpotomy incision was made upon entry into the posterior cul- de-sac.  The  uterosacral ligaments were bilaterally clamped, transected, suture ligated, and tagged for later identification.  The anterior cervix was circumferentially incised and the anterior cul-de-sac was entered without difficulty.  Cardinal ligaments were bilaterally clamped, transected, suture ligated with 0 Vicryl suture, and 2 additional bites bilaterally were used to ultimately free the uterus. The uterus was removed, passed off the operative field.  The  vaginal cuff was then closed with a running 0 Vicryl suture.  Good approximation of tissues and uterosacral ligaments were plicated centrally, and the rest of vaginal vault was closed.  Good hemostasis was noted.  Attention was then directed to the abdominal cavity again, which was insufflated. Good hemostasis was noted.  Good peristaltic activity was noted from the ureters bilaterally.  Pressure was lowered to 7 mmHg and again, good hemostasis was noted.  The patient's abdomen was then deflated and the infraumbilical incision was closed with a fascial layer with 2-0 Vicryl suture and all 3 incisions were closed with interrupted subcuticular sutures 4-0 Vicryl.  Sterile dressings were applied.  There were no complications.  Foley catheter was placed at the end of the case yielding normal clear urine.  There were no complications.  ESTIMATED BLOOD LOSS:  75 mL.  INTRAOPERATIVE FLUIDS:  750 mL.  URINE OUTPUT:  100 mL; 50 mL, the commencement of the case and 50 mL with the Foley catheter.  The patient was taken to recovery room in good condition.    ______________________________ Laverta Baltimore, MD   ______________________________ Laverta Baltimore, MD    TS/MEDQ  D:  12/23/2016  T:  12/23/2016  Job:  [id number]

## 2016-12-27 LAB — SURGICAL PATHOLOGY

## 2017-01-27 ENCOUNTER — Emergency Department: Payer: Medicaid Other

## 2017-01-27 ENCOUNTER — Emergency Department
Admission: EM | Admit: 2017-01-27 | Discharge: 2017-01-28 | Disposition: A | Discharge status: Home / Self Care | Payer: Medicaid Other | Attending: Emergency Medicine | Admitting: Emergency Medicine

## 2017-01-27 ENCOUNTER — Encounter: Payer: Self-pay | Admitting: Emergency Medicine

## 2017-01-27 DIAGNOSIS — R0789 Other chest pain: Secondary | ICD-10-CM | POA: Insufficient documentation

## 2017-01-27 DIAGNOSIS — J45909 Unspecified asthma, uncomplicated: Secondary | ICD-10-CM | POA: Insufficient documentation

## 2017-01-27 DIAGNOSIS — F17210 Nicotine dependence, cigarettes, uncomplicated: Secondary | ICD-10-CM | POA: Insufficient documentation

## 2017-01-27 DIAGNOSIS — J209 Acute bronchitis, unspecified: Secondary | ICD-10-CM | POA: Insufficient documentation

## 2017-01-27 DIAGNOSIS — J449 Chronic obstructive pulmonary disease, unspecified: Secondary | ICD-10-CM | POA: Diagnosis not present

## 2017-01-27 DIAGNOSIS — Z79899 Other long term (current) drug therapy: Secondary | ICD-10-CM | POA: Insufficient documentation

## 2017-01-27 DIAGNOSIS — R079 Chest pain, unspecified: Secondary | ICD-10-CM | POA: Diagnosis present

## 2017-01-27 DIAGNOSIS — J40 Bronchitis, not specified as acute or chronic: Secondary | ICD-10-CM

## 2017-01-27 LAB — CBC
HCT: 40 % (ref 35.0–47.0)
Hemoglobin: 13.6 g/dL (ref 12.0–16.0)
MCH: 26.6 pg (ref 26.0–34.0)
MCHC: 33.9 g/dL (ref 32.0–36.0)
MCV: 78.4 fL — AB (ref 80.0–100.0)
PLATELETS: 212 10*3/uL (ref 150–440)
RBC: 5.11 MIL/uL (ref 3.80–5.20)
RDW: 14.8 % — ABNORMAL HIGH (ref 11.5–14.5)
WBC: 11.7 10*3/uL — ABNORMAL HIGH (ref 3.6–11.0)

## 2017-01-27 LAB — BASIC METABOLIC PANEL
Anion gap: 9 (ref 5–15)
BUN: 9 mg/dL (ref 6–20)
CHLORIDE: 107 mmol/L (ref 101–111)
CO2: 21 mmol/L — AB (ref 22–32)
CREATININE: 0.68 mg/dL (ref 0.44–1.00)
Calcium: 8.9 mg/dL (ref 8.9–10.3)
GFR calc Af Amer: >60 mL/min (ref >60)
GFR calc non Af Amer: >60 mL/min (ref >60)
Glucose, Bld: 125 mg/dL — ABNORMAL HIGH (ref 65–99)
Potassium: 3.5 mmol/L (ref 3.5–5.1)
Sodium: 137 mmol/L (ref 135–145)

## 2017-01-27 LAB — TROPONIN I: Troponin I: <0.03 ng/mL (ref <0.03)

## 2017-01-27 NOTE — ED Notes (Signed)
Patient transported to X-ray 

## 2017-01-27 NOTE — ED Triage Notes (Signed)
Pt states that CP started yesterday afternoon and "hurts really bad when I breath". Pt reports having URI and coughing. Pt is able to talk in complete sentences and is in NAD at this time.

## 2017-01-28 MED ORDER — AZITHROMYCIN 250 MG PO TABS: ORAL_TABLET | ORAL | 0 refills | Status: AC

## 2017-01-28 MED ORDER — KETOROLAC TROMETHAMINE 30 MG/ML IJ SOLN
30.0000 mg | Freq: Once | INTRAMUSCULAR | Status: AC
  Administered 2017-01-28: 30 mg via INTRAVENOUS
  Filled 2017-01-28: qty 1

## 2017-01-28 MED ORDER — IBUPROFEN 600 MG PO TABS: 600.0000 mg | ORAL_TABLET | Freq: Four times a day (QID) | ORAL | 0 refills | Status: DC | PRN

## 2017-01-28 MED ORDER — ALBUTEROL SULFATE (2.5 MG/3ML) 0.083% IN NEBU
2.5000 mg | INHALATION_SOLUTION | Freq: Once | RESPIRATORY_TRACT | Status: AC
  Administered 2017-01-28: 2.5 mg via RESPIRATORY_TRACT
  Filled 2017-01-28: qty 3

## 2017-01-28 NOTE — ED Provider Notes (Signed)
[facility] Emergency Department Provider Note ____________________________________________   First MD Initiated Contact with Patient 01/28/17 0011     (approximate)  I have reviewed the triage vital signs and the nursing notes.   HISTORY  Chief Complaint Chest Pain    HPI Carrie Marshall is a 41 y.o. female Who presents with chest pain for 1 day, sharp and pressure-like, worse with deep breath and with cough, associated with nasal congestion and nonproductive cough, as well as with subjective chills. Patient reports mild shortness of breath when she exerts herself. He states that she had similar symptoms a few weeks ago that resolved after she took a Z-Pak but her son was sick within the last week and after he had similar symptoms she now started to have them again.  Patient denies leg pain or swelling. She had surgery one month ago but was only admitted for one day and has had no recent immobilization.  She is not on OCPs.   Past Medical History:  Diagnosis Date  . Anxiety   . Asthma   . Bipolar disorder (HCC)   . COPD (chronic obstructive pulmonary disease) (HCC)   . Depression   . Dyspnea   . GERD (gastroesophageal reflux disease)   . Headache   . Seizures (HCC) 2008   R/T to drugs    Patient Active Problem List   Diagnosis Date Noted  . Post-operative state 12/23/2016    Past Surgical History:  Procedure Laterality Date  . APPENDECTOMY    . LAPAROSCOPIC ASSISTED VAGINAL HYSTERECTOMY N/A 12/23/2016   Procedure: LAPAROSCOPIC ASSISTED VAGINAL HYSTERECTOMY;  Surgeon: Suzy Bouchard, MD;  Location: [facility] ORS;  Service: Gynecology;  Laterality: N/A;  . LAPAROSCOPIC LYSIS OF ADHESIONS  03/24/2016   Procedure: LAPAROSCOPIC LYSIS OF ADHESIONS;  Surgeon: Suzy Bouchard, MD;  Location: [facility] ORS;  Service: Gynecology;;  . LAPAROSCOPIC UNILATERAL SALPINGECTOMY Left 12/23/2016   Procedure: LAPAROSCOPIC UNILATERAL SALPINGECTOMY;  Surgeon:  Schermerhorn, Ihor Austin, MD;  Location: [facility] ORS;  Service: Gynecology;  Laterality: Left;  . LAPAROSCOPIC UNILATERAL SALPINGO OOPHERECTOMY Right 12/23/2016   Procedure: LAPAROSCOPIC UNILATERAL SALPINGO OOPHORECTOMY;  Surgeon: Schermerhorn, Ihor Austin, MD;  Location: [facility] ORS;  Service: Gynecology;  Laterality: Right;  . LAPAROSCOPY N/A 03/24/2016   Procedure: LAPAROSCOPY DIAGNOSTIC;  Surgeon: Suzy Bouchard, MD;  Location: [facility] ORS;  Service: Gynecology;  Laterality: N/A;  . LAPAROSCOPY N/A 03/24/2016   Procedure: LAPAROSCOPY OPERATIVE;  Surgeon: Suzy Bouchard, MD;  Location: [facility] ORS;  Service: Gynecology;  Laterality: N/A;  . TUBAL LIGATION      Prior to Admission medications   Medication Sig Start Date End Date Taking? Authorizing Provider  acetaminophen (TYLENOL) 500 MG tablet Take 1,000 mg by mouth every 6 (six) hours as needed for moderate pain or headache.    [provider]  docusate sodium (COLACE) 100 MG capsule Take 1 capsule (100 mg total) by mouth daily as needed. 12/24/16 12/24/17  Schermerhorn, Ihor Austin, MD  fluticasone (FLONASE) 50 MCG/ACT nasal spray Place 1 spray into both nostrils 2 (two) times daily.    [provider]  ibuprofen (ADVIL,MOTRIN) 800 MG tablet Take 1 tablet (800 mg total) by mouth every 8 (eight) hours as needed (mild pain). 12/24/16   Schermerhorn, Ihor Austin, MD  montelukast (SINGULAIR) 10 MG tablet Take 10 mg by mouth at bedtime.    [provider]  omeprazole (PRILOSEC) 40 MG capsule Take 40 mg by mouth daily.    [provider]  ondansetron (ZOFRAN) 4 MG tablet Take 1 tablet (4 mg total) by mouth every 6 (six) hours as needed for nausea. 12/24/16   Schermerhorn, Ihor Austin, MD  oxyCODONE-acetaminophen (PERCOCET/ROXICET) 5-325 MG tablet Take 1 tablet by mouth every 4 (four) hours as needed (moderate to severe pain (when tolerating fluids)). 12/24/16   Schermerhorn, Ihor Austin, MD  PROAIR HFA 108 [phone] Base) MCG/ACT inhaler  INHALE 2 PUFFS INTO THE LUNGS EVERY 4-6 HOURS AS NEEDED FOR WHEEZING. 11/01/16   [provider]    Allergies Gabapentin and Chantix [varenicline]  Family History  Problem Relation Age of Onset  . Adopted: Yes    Social History Social History  Substance Use Topics  . Smoking status: Current Every Day Smoker    Packs/day: 1.00    Types: Cigarettes  . Smokeless tobacco: Never Used  . Alcohol use No    Review of Systems  Constitutional: Positive for chills Eyes: No visual changes. ENT: No sore throat. Cardiovascular: Positive for chest pain. Respiratory: Positive for mild shortness of breath. Gastrointestinal: No nausea, no vomiting.  No diarrhea.  Genitourinary: Negative for dysuria.  Musculoskeletal: Negative for back pain. Skin: Negative for rash. Neurological: Positive for headaches, negative focal weakness or numbness.   ____________________________________________   PHYSICAL EXAM:  VITAL SIGNS: ED Triage Vitals  Enc Vitals Group     BP 01/27/17 2049 118/77     Pulse Rate 01/27/17 2049 77     Resp 01/27/17 2049 (!) 22     Temp 01/27/17 2049 98.2 F (36.8 C)     Temp Source 01/27/17 2049 Oral     SpO2 01/27/17 2049 98 %     Weight 01/27/17 2051 188 lb (85.3 kg)     Height 01/27/17 2051  (1.702 m)     Head Circumference --      Peak Flow --      Pain Score 01/27/17 2049 9     Pain Loc --      Pain Edu? --      Excl. in GC? --     Constitutional: Alert and oriented. Well appearing and in no acute distress. Eyes: Conjunctivae are normal.  Head: Atraumatic. Nose: No rhinnorhea. Mouth/Throat: Mucous membranes are moist.   Neck: Normal range of motion.  Cardiovascular: Normal rate, regular rhythm. Grossly normal heart sounds.  Good peripheral circulation. Respiratory: Normal respiratory effort.  No retractions. Bilat scattered ronchi, no wheeze.   Gastrointestinal: Soft and nontender. No distention.  Genitourinary: No CVA  tenderness. Musculoskeletal: No lower extremity edema.  Extremities warm and well perfused.  No calf tenderness.  Neurologic:  Normal speech and language. No gross focal neurologic deficits are appreciated.  Skin:  Skin is warm and dry. No rash noted. Psychiatric: Mood and affect are normal. Speech and behavior are normal.  ____________________________________________   LABS (all labs ordered are listed, but only abnormal results are displayed)  Labs Reviewed  BASIC METABOLIC PANEL - Abnormal; Notable for the following:       Result Value   CO2 21 (*)    Glucose, Bld 125 (*)    All other components within normal limits  CBC - Abnormal; Notable for the following:    WBC 11.7 (*)    MCV 78.4 (*)    RDW 14.8 (*)    All other components within normal limits  TROPONIN I   ____________________________________________  EKG  ED ECG REPORT I, Dionne Bucy, the attending physician, personally viewed and interpreted this ECG.  Date: 01/28/2017 EKG Time: 2045 Rate: 74 Rhythm: normal sinus rhythm QRS Axis: normal Intervals: normal ST/T Wave abnormalities: slightly flat T waves anterior leads, T wave inversion lead III Narrative Interpretation: no evidence of acute ischemia, no significant change from EKG dated 10/11/15  ____________________________________________  RADIOLOGY  Chest X ray with no acute findings.   ____________________________________________   PROCEDURES  Procedure(s) performed: No    Critical Care performed: No ____________________________________________   INITIAL IMPRESSION / ASSESSMENT AND PLAN / ED COURSE  Pertinent labs & imaging results that were available during my care of the patient were reviewed by me and considered in my medical decision making (see chart for details).  41 year old female presents with atypical chest pain for the last few days associated with cough, URI symptoms, chills, and some intermittent shortness of breath.  Vital signs are normal, patient is comfortable appearing, and exam is unremarkable except for slight rhonchi on lung exam. Chest x-ray with no focal infiltrate. Presentation consistent with likely bronchitis, possible bacterial, vs early/atypical pna.  Pain is most likely msk.  No ACS risk factors and given no EKG changes and negative troponin after pain >1 day, no indication for further cardiac workup.  Pt has no PE risk factors and is PERC negative except for surgery 1 month ago although she only stayed overnight and had no prolonged immobilization.  Based on pt's elevated WBC, presence of cough and lung exam findings, there is no evidence for DVT or PE.    Plan: trial of neb, NSAID, and likely d/c home with abx.      ----------------------------------------- 2:09 AM on 01/28/2017 -----------------------------------------  Patient states she is feeling somewhat better. Continues to remain comfortable appearing. Will d/c with azithromycin and ibuprofen. Patient is already on albuterol at home. Although patient recently was treated previously with a course of azithromycin, she had resolution of her symptoms at that time and only got sick again after being exposed to her family member, so there is no evidence for resistant bacteria or indication to treat with broader spectrum.  ____________________________________________   FINAL CLINICAL IMPRESSION(S) / ED DIAGNOSES  Final diagnoses:  Bronchitis  Atypical chest pain      NEW MEDICATIONS STARTED DURING THIS VISIT:  New Prescriptions   No medications on file     Note:  This document was prepared using Dragon voice recognition software and may include unintentional dictation errors.    Dionne Bucy, MD 01/28/17 [phone]

## 2017-01-28 NOTE — Discharge Instructions (Signed)
Return the ER for new or worsening chest pain, difficulty breathing, fever, weakness, or any other new or worsening symptoms that concern you.

## 2017-04-30 IMAGING — US US ABDOMEN LIMITED
1 series · 14 of 25 positions shown · non-contrast
Comparison: None.

CLINICAL DATA: Right upper quadrant pain. Pain since last night but
worsening today.

EXAM:
US ABDOMEN LIMITED - RIGHT UPPER QUADRANT

[Series 1: us abdomen limited · 0.20mm/px · 14 of 41 slices shown]
[im 1/41]
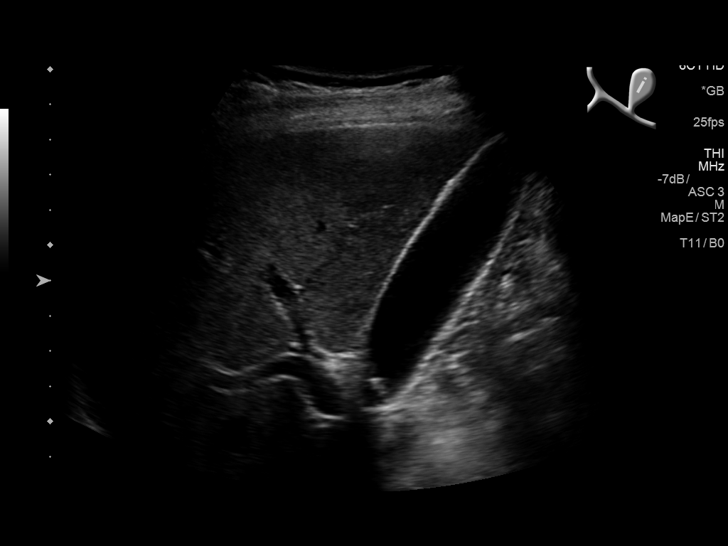
[im 4/41]
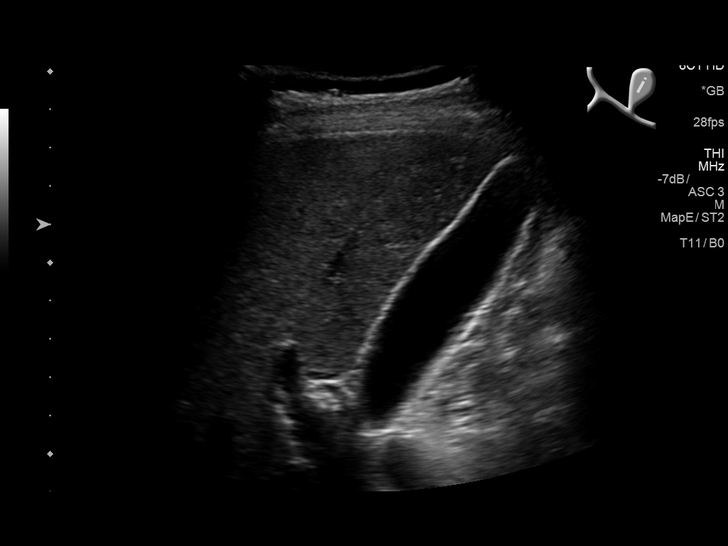
[im 7/41]
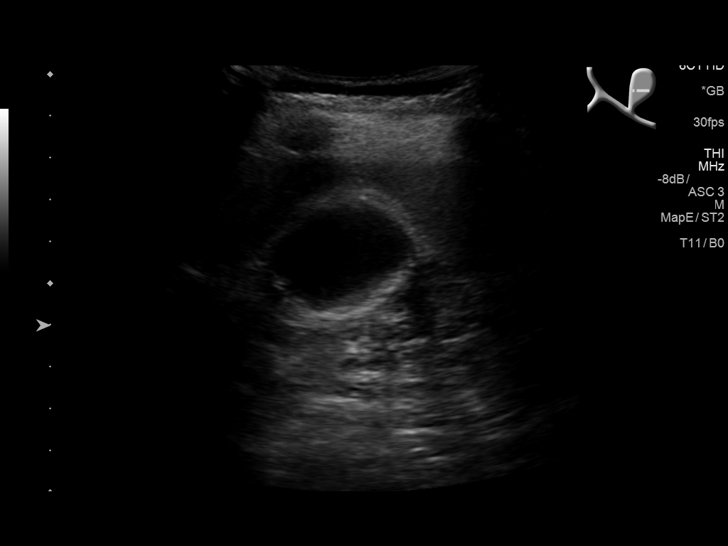
[im 11/41]
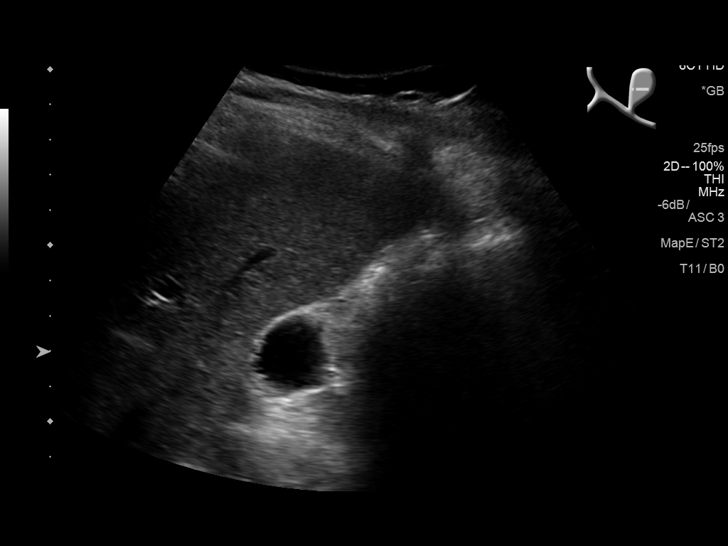
[im 14/41]
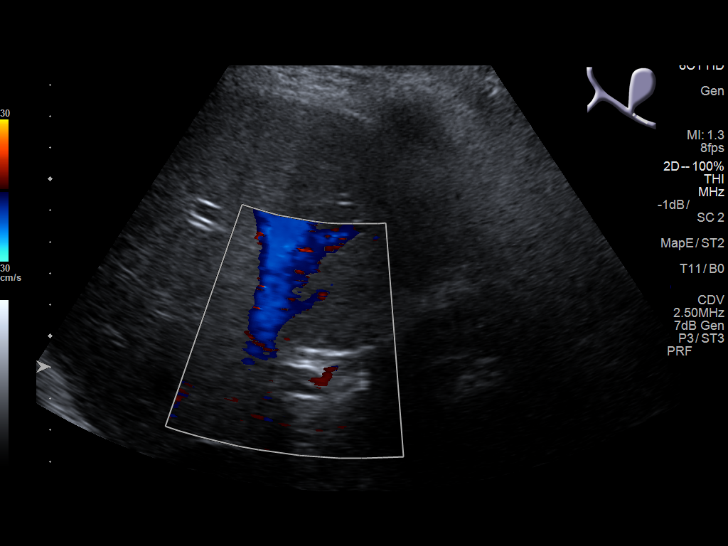
[im 16/41]
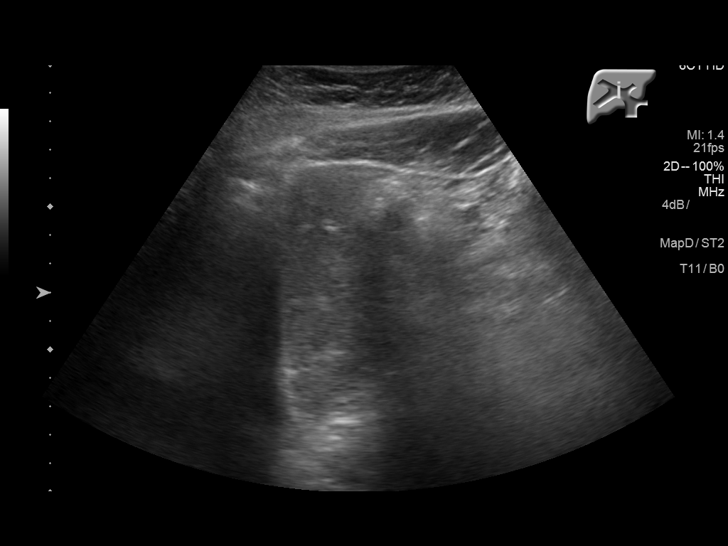
[im 19/41]
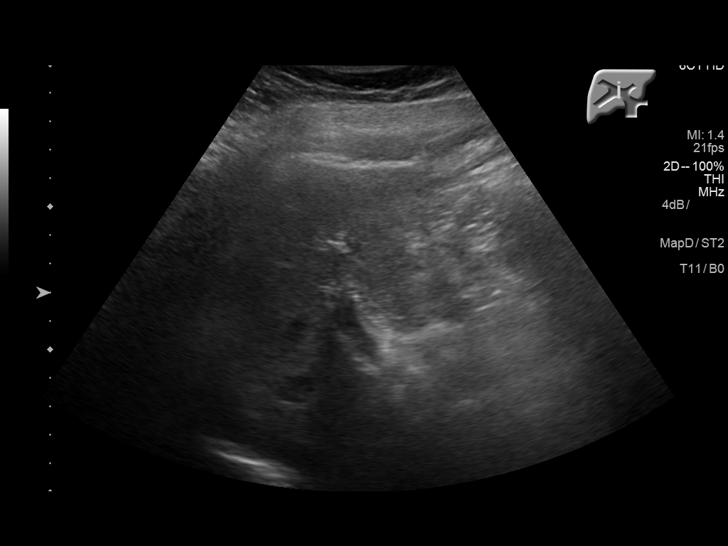
[im 22/41]
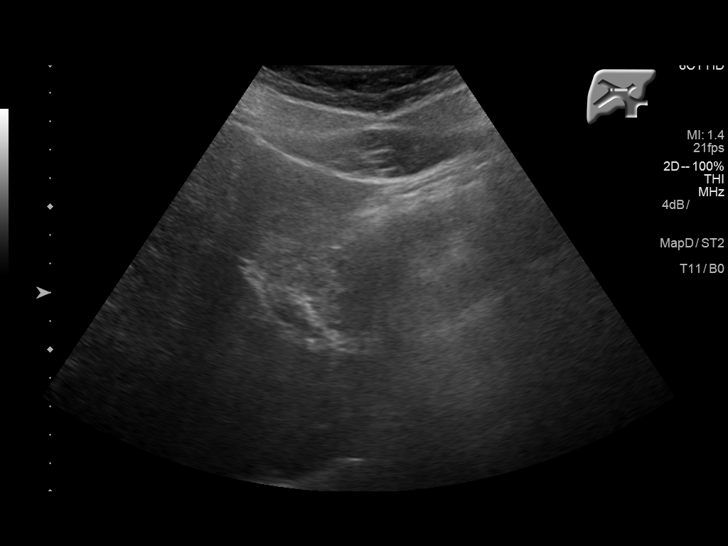
[im 26/41]
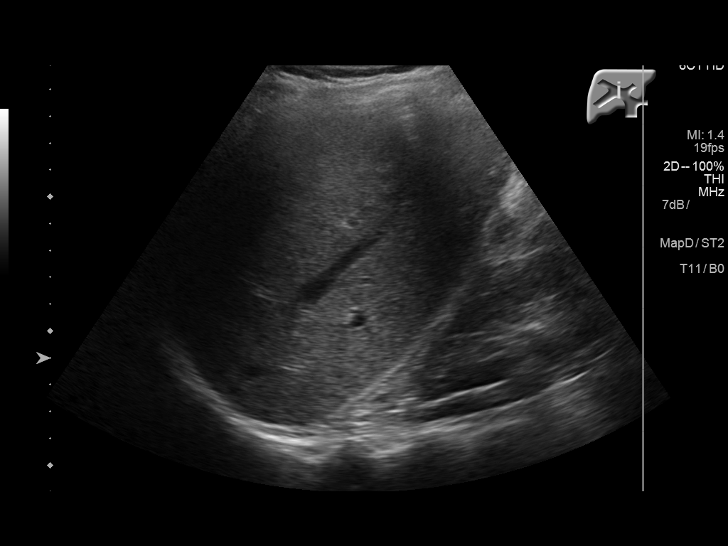
[im 27/41]
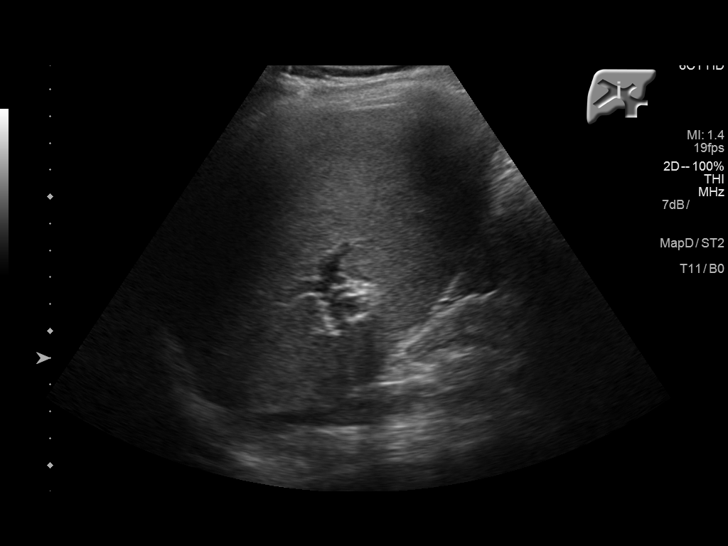
[im 31/41]
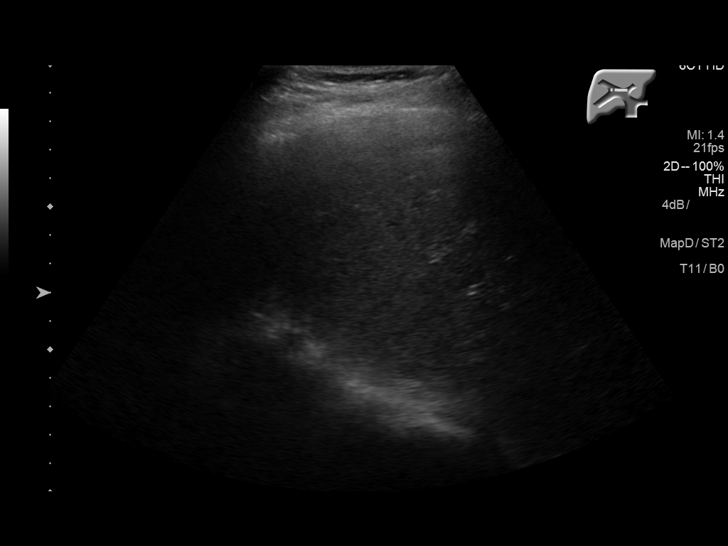
[im 34/41]
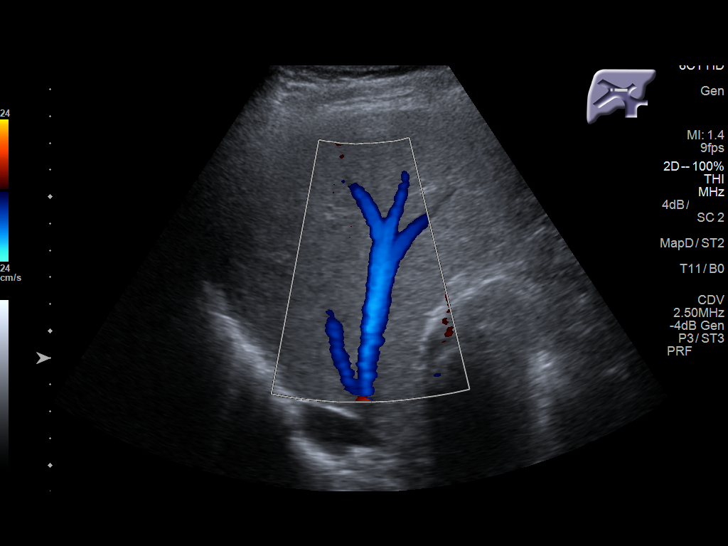
[im 37/41]
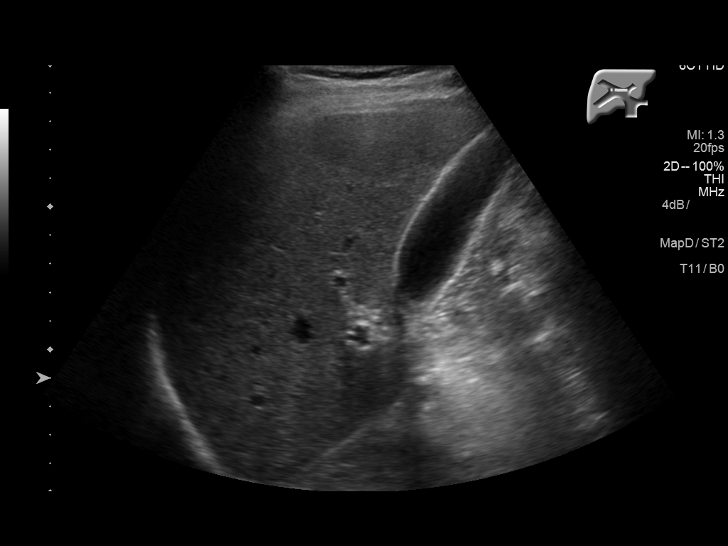
[im 41/41]
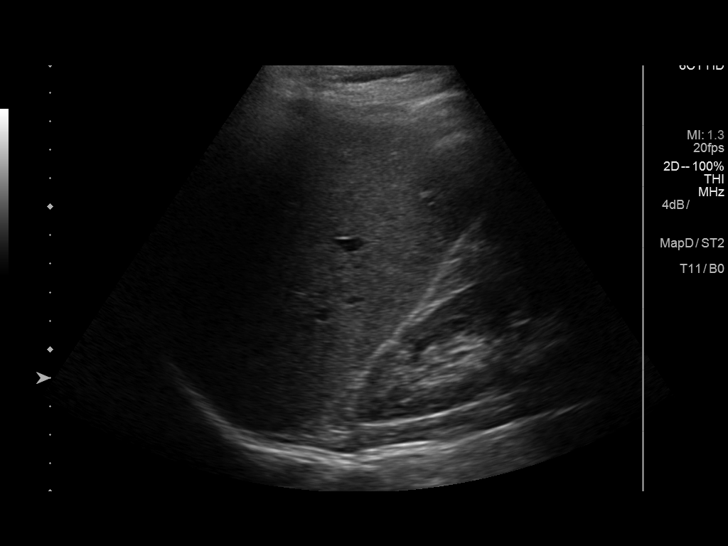

[14 of 25 positions shown; findings below may reference images not displayed]

FINDINGS: Gallbladder:

No gallstones or wall thickening visualized. No sonographic Murphy
sign noted by sonographer.

Common bile duct:

Diameter: 3 mm, normal

Liver:

No focal lesion identified. Within normal limits in parenchymal
echogenicity.
IMPRESSION: Normal examination.

## 2018-03-27 ENCOUNTER — Emergency Department: Payer: Medicaid Other

## 2018-03-27 ENCOUNTER — Inpatient Hospital Stay
Admission: EM | Admit: 2018-03-27 | Discharge: 2018-03-28 | DRG: 386 | Disposition: A | Discharge status: Home / Self Care | Payer: Medicaid Other | Attending: Internal Medicine | Admitting: Internal Medicine

## 2018-03-27 ENCOUNTER — Other Ambulatory Visit: Payer: Self-pay

## 2018-03-27 DIAGNOSIS — K219 Gastro-esophageal reflux disease without esophagitis: Secondary | ICD-10-CM | POA: Diagnosis present

## 2018-03-27 DIAGNOSIS — Z79899 Other long term (current) drug therapy: Secondary | ICD-10-CM

## 2018-03-27 DIAGNOSIS — A09 Infectious gastroenteritis and colitis, unspecified: Secondary | ICD-10-CM

## 2018-03-27 DIAGNOSIS — N390 Urinary tract infection, site not specified: Secondary | ICD-10-CM | POA: Diagnosis present

## 2018-03-27 DIAGNOSIS — A045 Campylobacter enteritis: Secondary | ICD-10-CM | POA: Diagnosis present

## 2018-03-27 DIAGNOSIS — J449 Chronic obstructive pulmonary disease, unspecified: Secondary | ICD-10-CM | POA: Diagnosis present

## 2018-03-27 DIAGNOSIS — Z7951 Long term (current) use of inhaled steroids: Secondary | ICD-10-CM

## 2018-03-27 DIAGNOSIS — K5100 Ulcerative (chronic) pancolitis without complications: Principal | ICD-10-CM | POA: Diagnosis present

## 2018-03-27 DIAGNOSIS — F419 Anxiety disorder, unspecified: Secondary | ICD-10-CM | POA: Diagnosis present

## 2018-03-27 DIAGNOSIS — Z9851 Tubal ligation status: Secondary | ICD-10-CM

## 2018-03-27 DIAGNOSIS — Z888 Allergy status to other drugs, medicaments and biological substances status: Secondary | ICD-10-CM

## 2018-03-27 DIAGNOSIS — E876 Hypokalemia: Secondary | ICD-10-CM | POA: Diagnosis present

## 2018-03-27 DIAGNOSIS — F17210 Nicotine dependence, cigarettes, uncomplicated: Secondary | ICD-10-CM | POA: Diagnosis present

## 2018-03-27 DIAGNOSIS — K529 Noninfective gastroenteritis and colitis, unspecified: Secondary | ICD-10-CM

## 2018-03-27 LAB — CBC
HEMATOCRIT: 44.6 % (ref 36.0–46.0)
Hemoglobin: 14.3 g/dL (ref 12.0–15.0)
MCH: 25.8 pg — AB (ref 26.0–34.0)
MCHC: 32.1 g/dL (ref 30.0–36.0)
MCV: 80.4 fL (ref 80.0–100.0)
PLATELETS: 230 10*3/uL (ref 150–400)
RBC: 5.55 MIL/uL — ABNORMAL HIGH (ref 3.87–5.11)
RDW: 14 % (ref 11.5–15.5)
WBC: 10.7 10*3/uL — ABNORMAL HIGH (ref 4.0–10.5)
nRBC: 0 % (ref 0.0–0.2)

## 2018-03-27 LAB — COMPREHENSIVE METABOLIC PANEL
ALBUMIN: 3.5 g/dL (ref 3.5–5.0)
ALK PHOS: 93 U/L (ref 38–126)
ALT: 15 U/L (ref 0–44)
ANION GAP: 9 (ref 5–15)
AST: 17 U/L (ref 15–41)
BUN: 11 mg/dL (ref 6–20)
CALCIUM: 8.3 mg/dL — AB (ref 8.9–10.3)
CHLORIDE: 103 mmol/L (ref 98–111)
CO2: 22 mmol/L (ref 22–32)
Creatinine, Ser: 0.74 mg/dL (ref 0.44–1.00)
GFR calc Af Amer: >60 mL/min (ref >60)
GFR calc non Af Amer: >60 mL/min (ref >60)
GLUCOSE: 150 mg/dL — AB (ref 70–99)
Potassium: 3 mmol/L — ABNORMAL LOW (ref 3.5–5.1)
SODIUM: 134 mmol/L — AB (ref 135–145)
Total Bilirubin: 0.4 mg/dL (ref 0.3–1.2)
Total Protein: 7.2 g/dL (ref 6.5–8.1)

## 2018-03-27 LAB — URINALYSIS, COMPLETE (UACMP) WITH MICROSCOPIC
BILIRUBIN URINE: NEGATIVE
Glucose, UA: NEGATIVE mg/dL
KETONES UR: NEGATIVE mg/dL
Nitrite: NEGATIVE
PH: 6 (ref 5.0–8.0)
PROTEIN: NEGATIVE mg/dL
Specific Gravity, Urine: 1.014 (ref 1.005–1.030)

## 2018-03-27 LAB — GASTROINTESTINAL PANEL BY PCR, STOOL (REPLACES STOOL CULTURE)
ADENOVIRUS F40/41: NOT DETECTED
ASTROVIRUS: NOT DETECTED
Campylobacter species: DETECTED — AB
Cryptosporidium: NOT DETECTED
Cyclospora cayetanensis: NOT DETECTED
ENTAMOEBA HISTOLYTICA: NOT DETECTED
ENTEROPATHOGENIC E COLI (EPEC): NOT DETECTED
ENTEROTOXIGENIC E COLI (ETEC): NOT DETECTED
Enteroaggregative E coli (EAEC): NOT DETECTED
Giardia lamblia: NOT DETECTED
Norovirus GI/GII: NOT DETECTED
PLESIMONAS SHIGELLOIDES: NOT DETECTED
Rotavirus A: NOT DETECTED
SAPOVIRUS (I, II, IV, AND V): NOT DETECTED
Salmonella species: NOT DETECTED
Shiga like toxin producing E coli (STEC): NOT DETECTED
Shigella/Enteroinvasive E coli (EIEC): NOT DETECTED
Vibrio cholerae: NOT DETECTED
Vibrio species: NOT DETECTED
Yersinia enterocolitica: NOT DETECTED

## 2018-03-27 LAB — TROPONIN I

## 2018-03-27 LAB — POCT PREGNANCY, URINE: Preg Test, Ur: NEGATIVE

## 2018-03-27 LAB — C DIFFICILE QUICK SCREEN W PCR REFLEX
C DIFFICILE (CDIFF) TOXIN: NEGATIVE
C DIFFICLE (CDIFF) ANTIGEN: NEGATIVE
C Diff interpretation: NOT DETECTED

## 2018-03-27 LAB — LIPASE, BLOOD: LIPASE: 25 U/L (ref 11–51)

## 2018-03-27 MED ORDER — SODIUM CHLORIDE 0.9 % IV BOLUS
1000.0000 mL | Freq: Once | INTRAVENOUS | Status: AC
  Administered 2018-03-27: 1000 mL via INTRAVENOUS

## 2018-03-27 MED ORDER — MORPHINE SULFATE (PF) 2 MG/ML IV SOLN
INTRAVENOUS | Status: AC
  Filled 2018-03-27: qty 1

## 2018-03-27 MED ORDER — IOPAMIDOL (ISOVUE-300) INJECTION 61%
100.0000 mL | Freq: Once | INTRAVENOUS | Status: AC | PRN
  Administered 2018-03-27: 100 mL via INTRAVENOUS

## 2018-03-27 MED ORDER — SODIUM CHLORIDE 0.9 % IV SOLN
500.0000 mg | Freq: Once | INTRAVENOUS | Status: AC
  Administered 2018-03-27: 500 mg via INTRAVENOUS
  Filled 2018-03-27: qty 500

## 2018-03-27 MED ORDER — MORPHINE SULFATE (PF) 2 MG/ML IV SOLN
2.0000 mg | Freq: Once | INTRAVENOUS | Status: AC
  Administered 2018-03-27: 2 mg via INTRAVENOUS

## 2018-03-27 MED ORDER — HYDROMORPHONE HCL 1 MG/ML IJ SOLN
0.5000 mg | Freq: Once | INTRAMUSCULAR | Status: AC
  Administered 2018-03-27: 0.5 mg via INTRAVENOUS
  Filled 2018-03-27: qty 1

## 2018-03-27 MED ORDER — MORPHINE SULFATE (PF) 4 MG/ML IV SOLN
4.0000 mg | Freq: Once | INTRAVENOUS | Status: AC
  Administered 2018-03-27: 4 mg via INTRAVENOUS
  Filled 2018-03-27: qty 1

## 2018-03-27 MED ORDER — ONDANSETRON HCL 4 MG/2ML IJ SOLN
INTRAMUSCULAR | Status: AC
  Filled 2018-03-27: qty 2

## 2018-03-27 MED ORDER — IOPAMIDOL (ISOVUE-300) INJECTION 61%
30.0000 mL | Freq: Once | INTRAVENOUS | Status: AC
  Administered 2018-03-27: 30 mL via ORAL

## 2018-03-27 MED ORDER — ONDANSETRON HCL 4 MG/2ML IJ SOLN
4.0000 mg | Freq: Once | INTRAMUSCULAR | Status: AC
  Administered 2018-03-27: 4 mg via INTRAVENOUS

## 2018-03-27 NOTE — ED Notes (Addendum)
Pt states she is unable to drink the 2nd bottle of contrast. Dr Darnelle CatalanMalinda made aware, and states it's ok for pt to go to CT at this time after drinking 1 1/2 bottles.

## 2018-03-27 NOTE — ED Triage Notes (Signed)
C/o SOB, diarrhea, abd and chest pain x 2 days. Pt tachypenic in triage. A&O.

## 2018-03-27 NOTE — ED Notes (Signed)
Dr Darnelle CatalanMalinda made aware at this time of pt's GI panel testing positive for Campylobacter per lab.

## 2018-03-27 NOTE — ED Provider Notes (Addendum)
[facility] Emergency Department Provider Note   ____________________________________________   First MD Initiated Contact with Patient 03/27/18 1954     (approximate)  I have reviewed the triage vital signs and the nursing notes.   HISTORY  Chief Complaint Shortness of Breath and Diarrhea    HPI Carrie Marshall is a 42 y.o. female complains of diarrhea abdominal and chest pain for 2 days the pain is making her feeling feel short of breath.  She has not been hypoxic.  Pain is getting worse and severe not crampy diffuse across her abdomen.  Feels deep and achy.   Past Medical History:  Diagnosis Date  . Anxiety   . Asthma   . Bipolar disorder (HCC)   . COPD (chronic obstructive pulmonary disease) (HCC)   . Depression   . Dyspnea   . GERD (gastroesophageal reflux disease)   . Headache   . Seizures (HCC) 2008   R/T to drugs    Patient Active Problem List   Diagnosis Date Noted  . Post-operative state 12/23/2016    Past Surgical History:  Procedure Laterality Date  . APPENDECTOMY    . LAPAROSCOPIC ASSISTED VAGINAL HYSTERECTOMY N/A 12/23/2016   Procedure: LAPAROSCOPIC ASSISTED VAGINAL HYSTERECTOMY;  Surgeon: Suzy Bouchard, MD;  Location: [facility] ORS;  Service: Gynecology;  Laterality: N/A;  . LAPAROSCOPIC LYSIS OF ADHESIONS  03/24/2016   Procedure: LAPAROSCOPIC LYSIS OF ADHESIONS;  Surgeon: Suzy Bouchard, MD;  Location: [facility] ORS;  Service: Gynecology;;  . LAPAROSCOPIC UNILATERAL SALPINGECTOMY Left 12/23/2016   Procedure: LAPAROSCOPIC UNILATERAL SALPINGECTOMY;  Surgeon: Schermerhorn, Ihor Austin, MD;  Location: [facility] ORS;  Service: Gynecology;  Laterality: Left;  . LAPAROSCOPIC UNILATERAL SALPINGO OOPHERECTOMY Right 12/23/2016   Procedure: LAPAROSCOPIC UNILATERAL SALPINGO OOPHORECTOMY;  Surgeon: Schermerhorn, Ihor Austin, MD;  Location: [facility] ORS;  Service: Gynecology;  Laterality: Right;  . LAPAROSCOPY N/A 03/24/2016   Procedure: LAPAROSCOPY  DIAGNOSTIC;  Surgeon: Suzy Bouchard, MD;  Location: [facility] ORS;  Service: Gynecology;  Laterality: N/A;  . LAPAROSCOPY N/A 03/24/2016   Procedure: LAPAROSCOPY OPERATIVE;  Surgeon: Suzy Bouchard, MD;  Location: [facility] ORS;  Service: Gynecology;  Laterality: N/A;  . TUBAL LIGATION      Prior to Admission medications   Medication Sig Start Date End Date Taking? Authorizing Provider  acetaminophen (TYLENOL) 500 MG tablet Take 1,000 mg by mouth every 6 (six) hours as needed for moderate pain or headache.    [provider]  fluticasone (FLONASE) 50 MCG/ACT nasal spray Place 1 spray into both nostrils 2 (two) times daily.    [provider]  ibuprofen (ADVIL,MOTRIN) 600 MG tablet Take 1 tablet (600 mg total) by mouth every 6 (six) hours as needed for fever or moderate pain. 01/28/17   Dionne Bucy, MD  montelukast (SINGULAIR) 10 MG tablet Take 10 mg by mouth at bedtime.    [provider]  omeprazole (PRILOSEC) 40 MG capsule Take 40 mg by mouth daily.    [provider]  ondansetron (ZOFRAN) 4 MG tablet Take 1 tablet (4 mg total) by mouth every 6 (six) hours as needed for nausea. 12/24/16   Schermerhorn, Ihor Austin, MD  oxyCODONE-acetaminophen (PERCOCET/ROXICET) 5-325 MG tablet Take 1 tablet by mouth every 4 (four) hours as needed (moderate to severe pain (when tolerating fluids)). 12/24/16   Schermerhorn, Ihor Austin, MD  PROAIR HFA [phone] Base) MCG/ACT inhaler INHALE 2 PUFFS INTO THE LUNGS EVERY 4-6 HOURS AS NEEDED FOR WHEEZING. 11/01/16   [provider]  Allergies Gabapentin and Chantix [varenicline]  Family History  Adopted: Yes    Social History Social History   Tobacco Use  . Smoking status: Current Every Day Smoker    Packs/day: 1.00    Types: Cigarettes  . Smokeless tobacco: Never Used  Substance Use Topics  . Alcohol use: No  . Drug use: Yes    Types: Marijuana    Comment: In the past--none since 2012 -crack   mj- 3  weeks ago    Review of Systems  Constitutional: No fever/chills Eyes: No visual changes. ENT: No sore throat. Cardiovascular chest pain. Respiratory:shortness of breath. Gastrointestinal:  abdominal pain.   nausea,  vomiting.   diarrhea.  No constipation. Genitourinary: Negative for dysuria. Musculoskeletal: Negative for back pain. Skin: Negative for rash. Neurological: Negative for headaches, focal weakness  ____________________________________________   PHYSICAL EXAM:  VITAL SIGNS: ED Triage Vitals  Enc Vitals Group     BP 03/27/18 1848 96/72     Pulse Rate 03/27/18 1848 100     Resp 03/27/18 1848 (!) 22     Temp 03/27/18 1848 98.6 F (37 C)     Temp Source 03/27/18 1848 Oral     SpO2 03/27/18 1848 95 %     Weight 03/27/18 1847 190 lb (86.2 kg)     Height 03/27/18 1847 5\' 7"  (1.702 m)     Head Circumference --      Peak Flow --      Pain Score 03/27/18 1847 10     Pain Loc --      Pain Edu? --      Excl. in GC? --     Constitutional: Alert and oriented.  Ill-appearing and pain Eyes: Conjunctivae are normal.  Head: Atraumatic. Nose: No congestion/rhinnorhea. Mouth/Throat: Mucous membranes are moist.  Oropharynx non-erythematous. Neck: No stridor. Cardiovascular: Normal rate, regular rhythm. Grossly normal heart sounds.  Good peripheral circulation. Respiratory: Normal respiratory effort.  No retractions. Lungs CTAB. Gastrointestinal: Soft usually tender to palpation percussion. No distention. No abdominal bruits. No CVA tenderness. Musculoskeletal: No lower extremity tenderness nor edema.  No joint effusions. Neurologic:  Normal speech and language. No gross focal neurologic deficits are appreciated.. Skin:  Skin is warm, dry and intact. No rash noted. Psychiatric: Mood and affect are normal. Speech and behavior are normal.  ____________________________________________   LABS (all labs ordered are listed, but only abnormal results are displayed)  Labs  Reviewed  GASTROINTESTINAL PANEL BY PCR, STOOL (REPLACES STOOL CULTURE) - Abnormal; Notable for the following components:      Result Value   Campylobacter species DETECTED (*)    All other components within normal limits  COMPREHENSIVE METABOLIC PANEL - Abnormal; Notable for the following components:   Sodium 134 (*)    Potassium 3.0 (*)    Glucose, Bld 150 (*)    Calcium 8.3 (*)    All other components within normal limits  CBC - Abnormal; Notable for the following components:   WBC 10.7 (*)    RBC 5.55 (*)    MCH 25.8 (*)    All other components within normal limits  URINALYSIS, COMPLETE (UACMP) WITH MICROSCOPIC - Abnormal; Notable for the following components:   Color, Urine YELLOW (*)    APPearance HAZY (*)    Hgb urine dipstick MODERATE (*)    Leukocytes, UA LARGE (*)    Bacteria, UA RARE (*)    All other components within normal limits  C DIFFICILE QUICK SCREEN W PCR REFLEX  LIPASE, BLOOD  TROPONIN I  POC URINE PREG, ED  POCT PREGNANCY, URINE   ____________________________________________  EKG EKG read and interpreted by me shows normal sinus rhythm rate of 99 left axis there is flipped T's inferiorly and in most of the V leads.  This is similar but perhaps somewhat worse than previous EKG.  ____________________________________________  RADIOLOGY  ED MD interpretation: Chest x-ray read by radiology reviewed by me does not show any acute illness CT read by radiology shows colitis and proctitis  Official radiology report(s): Ct Abdomen Pelvis W Contrast  Result Date: 03/27/2018 CLINICAL DATA:  Dyspnea, diarrhea and abdominal pain x2 days. History of tubal ligation, appendectomy and hysterectomy. EXAM: CT ABDOMEN AND PELVIS WITH CONTRAST TECHNIQUE: Multidetector CT imaging of the abdomen and pelvis was performed using the standard protocol following bolus administration of intravenous contrast. CONTRAST:  ISOVUE-300 IOPAMIDOL (ISOVUE-300) INJECTION 61%  COMPARISON:  02/29/2016 FINDINGS: Lower chest: No acute abnormality. Hepatobiliary: No focal liver abnormality is seen. No gallstones, gallbladder wall thickening, or biliary dilatation. Pancreas: Unremarkable. No pancreatic ductal dilatation or surrounding inflammatory changes. Spleen: Normal in size without focal abnormality. Adrenals/Urinary Tract: Adrenal glands are unremarkable. Kidneys are normal, without renal calculi, focal lesion, or hydronephrosis. Bladder is unremarkable. Stomach/Bowel: Diffuse mucosal enhancement and mild transmural thickening of the colon from cecum to rectum consistent with pancolitis and proctitis. No bowel perforation or obstruction. Small bowel loops are unremarkable. Vascular/Lymphatic: No significant vascular findings are present. No enlarged abdominal or pelvic lymph nodes. Reproductive: Status post hysterectomy. No adnexal masses. Other: Tiny periumbilical fat containing hernia. No abdominopelvic ascites. Musculoskeletal: No acute or significant osseous findings. IMPRESSION: Pancolitis and proctitis. Electronically Signed   By: Tollie Eth M.D.   On: 03/27/2018 22:45   Dg Chest Portable 1 View  Result Date: 03/27/2018 CLINICAL DATA:  Shortness of breath.  Diarrhea.  Chest pain. EXAM: PORTABLE CHEST 1 VIEW COMPARISON:  January 27, 2017 FINDINGS: The heart size and mediastinal contours are within normal limits. Both lungs are clear. The visualized skeletal structures are unremarkable. IMPRESSION: No active disease. Electronically Signed   By: Gerome Sam III M.D   On: 03/27/2018 20:07    ____________________________________________   PROCEDURES  Procedure(s) performed:   Procedures  Critical Care performed:   ____________________________________________   INITIAL IMPRESSION / ASSESSMENT AND PLAN / ED COURSE  She is having difficulty keeping down fluids and is still in a lot of pain after 6 of morphine.  She has pancolitis and proctitis and feels short  of breath.  I will get her in the hospital at least overnight to start her on some antibiotics.         ____________________________________________   FINAL CLINICAL IMPRESSION(S) / ED DIAGNOSES  Final diagnoses:  Diarrhea of infectious origin  Campylobacter diarrhea  Colitis     ED Discharge Orders    None       Note:  This document was prepared using Dragon voice recognition software and may include unintentional dictation errors.    Arnaldo Natal, MD 03/27/18 2317    Arnaldo Natal, MD 04/06/18 [phone]

## 2018-03-27 NOTE — ED Notes (Signed)
Blue top sent to lab as well.  

## 2018-03-28 ENCOUNTER — Other Ambulatory Visit: Payer: Self-pay

## 2018-03-28 DIAGNOSIS — E876 Hypokalemia: Secondary | ICD-10-CM | POA: Diagnosis present

## 2018-03-28 DIAGNOSIS — R0602 Shortness of breath: Secondary | ICD-10-CM | POA: Diagnosis present

## 2018-03-28 DIAGNOSIS — Z7951 Long term (current) use of inhaled steroids: Secondary | ICD-10-CM | POA: Diagnosis not present

## 2018-03-28 DIAGNOSIS — J449 Chronic obstructive pulmonary disease, unspecified: Secondary | ICD-10-CM | POA: Diagnosis present

## 2018-03-28 DIAGNOSIS — K5100 Ulcerative (chronic) pancolitis without complications: Secondary | ICD-10-CM | POA: Diagnosis present

## 2018-03-28 DIAGNOSIS — Z888 Allergy status to other drugs, medicaments and biological substances status: Secondary | ICD-10-CM | POA: Diagnosis not present

## 2018-03-28 DIAGNOSIS — N390 Urinary tract infection, site not specified: Secondary | ICD-10-CM | POA: Diagnosis present

## 2018-03-28 DIAGNOSIS — A045 Campylobacter enteritis: Secondary | ICD-10-CM | POA: Diagnosis present

## 2018-03-28 DIAGNOSIS — K219 Gastro-esophageal reflux disease without esophagitis: Secondary | ICD-10-CM | POA: Diagnosis present

## 2018-03-28 DIAGNOSIS — F17210 Nicotine dependence, cigarettes, uncomplicated: Secondary | ICD-10-CM | POA: Diagnosis present

## 2018-03-28 DIAGNOSIS — Z9851 Tubal ligation status: Secondary | ICD-10-CM | POA: Diagnosis not present

## 2018-03-28 DIAGNOSIS — F419 Anxiety disorder, unspecified: Secondary | ICD-10-CM | POA: Diagnosis present

## 2018-03-28 DIAGNOSIS — Z79899 Other long term (current) drug therapy: Secondary | ICD-10-CM | POA: Diagnosis not present

## 2018-03-28 LAB — BASIC METABOLIC PANEL
ANION GAP: 7 (ref 5–15)
BUN: 8 mg/dL (ref 6–20)
CHLORIDE: 107 mmol/L (ref 98–111)
CO2: 24 mmol/L (ref 22–32)
Calcium: 7.1 mg/dL — ABNORMAL LOW (ref 8.9–10.3)
Creatinine, Ser: 0.61 mg/dL (ref 0.44–1.00)
GFR calc Af Amer: >60 mL/min (ref >60)
GLUCOSE: 98 mg/dL (ref 70–99)
POTASSIUM: 3.2 mmol/L — AB (ref 3.5–5.1)
Sodium: 138 mmol/L (ref 135–145)

## 2018-03-28 LAB — MAGNESIUM: Magnesium: 2 mg/dL (ref 1.7–2.4)

## 2018-03-28 LAB — CBC
HEMATOCRIT: 37.5 % (ref 36.0–46.0)
HEMOGLOBIN: 12 g/dL (ref 12.0–15.0)
MCH: 26 pg (ref 26.0–34.0)
MCHC: 32 g/dL (ref 30.0–36.0)
MCV: 81.3 fL (ref 80.0–100.0)
Platelets: 180 10*3/uL (ref 150–400)
RBC: 4.61 MIL/uL (ref 3.87–5.11)
RDW: 14.1 % (ref 11.5–15.5)
WBC: 7 10*3/uL (ref 4.0–10.5)
nRBC: 0 % (ref 0.0–0.2)

## 2018-03-28 LAB — GLUCOSE, CAPILLARY: Glucose-Capillary: 114 mg/dL — ABNORMAL HIGH (ref 70–99)

## 2018-03-28 LAB — POTASSIUM: POTASSIUM: 3.2 mmol/L — AB (ref 3.5–5.1)

## 2018-03-28 MED ORDER — SODIUM CHLORIDE 0.9 % IV SOLN
500.0000 mg | INTRAVENOUS | Status: DC
  Filled 2018-03-28: qty 500

## 2018-03-28 MED ORDER — AZITHROMYCIN 250 MG PO TABS: 250.0000 mg | ORAL_TABLET | Freq: Every day | ORAL | 0 refills | Status: AC

## 2018-03-28 MED ORDER — ONDANSETRON HCL 4 MG PO TABS: 4.0000 mg | ORAL_TABLET | Freq: Four times a day (QID) | ORAL | Status: DC | PRN

## 2018-03-28 MED ORDER — ONDANSETRON HCL 4 MG PO TABS: 4.0000 mg | ORAL_TABLET | Freq: Four times a day (QID) | ORAL | 0 refills | Status: DC | PRN

## 2018-03-28 MED ORDER — ALUM & MAG HYDROXIDE-SIMETH 200-200-20 MG/5ML PO SUSP
30.0000 mL | ORAL | Status: DC | PRN
  Administered 2018-03-28: 30 mL via ORAL
  Filled 2018-03-28: qty 30

## 2018-03-28 MED ORDER — SODIUM CHLORIDE 0.9 % IV SOLN
INTRAVENOUS | Status: DC
  Administered 2018-03-28 (×2): via INTRAVENOUS

## 2018-03-28 MED ORDER — HYDROCODONE-ACETAMINOPHEN 5-325 MG PO TABS: 1.0000 | ORAL_TABLET | Freq: Four times a day (QID) | ORAL | 0 refills | Status: DC | PRN

## 2018-03-28 MED ORDER — PANTOPRAZOLE SODIUM 40 MG PO TBEC
40.0000 mg | DELAYED_RELEASE_TABLET | Freq: Every day | ORAL | Status: DC
  Administered 2018-03-28: 40 mg via ORAL
  Filled 2018-03-28: qty 1

## 2018-03-28 MED ORDER — HEPARIN SODIUM (PORCINE) 5000 UNIT/ML IJ SOLN: 5000.0000 [IU] | Freq: Three times a day (TID) | INTRAMUSCULAR | Status: DC

## 2018-03-28 MED ORDER — FAMOTIDINE IN NACL 20-0.9 MG/50ML-% IV SOLN
20.0000 mg | Freq: Once | INTRAVENOUS | Status: AC
  Administered 2018-03-28: 20 mg via INTRAVENOUS
  Filled 2018-03-28: qty 50

## 2018-03-28 MED ORDER — HYDROMORPHONE HCL 1 MG/ML IJ SOLN
0.5000 mg | INTRAMUSCULAR | Status: DC | PRN
  Administered 2018-03-28 (×2): 0.5 mg via INTRAVENOUS
  Filled 2018-03-28 (×2): qty 0.5

## 2018-03-28 MED ORDER — DOCUSATE SODIUM 100 MG PO CAPS: 100.0000 mg | ORAL_CAPSULE | Freq: Two times a day (BID) | ORAL | Status: DC

## 2018-03-28 MED ORDER — ACETAMINOPHEN 650 MG RE SUPP: 650.0000 mg | Freq: Four times a day (QID) | RECTAL | Status: DC | PRN

## 2018-03-28 MED ORDER — FLUTICASONE PROPIONATE 50 MCG/ACT NA SUSP
1.0000 | Freq: Two times a day (BID) | NASAL | Status: DC
  Filled 2018-03-28: qty 16

## 2018-03-28 MED ORDER — BISACODYL 5 MG PO TBEC: 5.0000 mg | DELAYED_RELEASE_TABLET | Freq: Every day | ORAL | Status: DC | PRN

## 2018-03-28 MED ORDER — TRAZODONE HCL 50 MG PO TABS: 25.0000 mg | ORAL_TABLET | Freq: Every evening | ORAL | Status: DC | PRN

## 2018-03-28 MED ORDER — INFLUENZA VAC SPLIT QUAD 0.5 ML IM SUSY: 0.5000 mL | PREFILLED_SYRINGE | INTRAMUSCULAR | Status: DC

## 2018-03-28 MED ORDER — POTASSIUM CHLORIDE 10 MEQ/100ML IV SOLN: 10.0000 meq | INTRAVENOUS | Status: AC

## 2018-03-28 MED ORDER — MONTELUKAST SODIUM 10 MG PO TABS
10.0000 mg | ORAL_TABLET | Freq: Every day | ORAL | Status: DC
  Filled 2018-03-28: qty 1

## 2018-03-28 MED ORDER — SODIUM CHLORIDE 0.9 % IV SOLN
1.0000 g | INTRAVENOUS | Status: DC
  Administered 2018-03-28: 1 g via INTRAVENOUS
  Filled 2018-03-28: qty 10

## 2018-03-28 MED ORDER — AMITRIPTYLINE HCL 10 MG PO TABS
10.0000 mg | ORAL_TABLET | Freq: Every evening | ORAL | Status: DC | PRN
  Filled 2018-03-28: qty 1

## 2018-03-28 MED ORDER — HYDROCODONE-ACETAMINOPHEN 5-325 MG PO TABS
1.0000 | ORAL_TABLET | ORAL | Status: DC | PRN
  Administered 2018-03-28 (×2): 2 via ORAL
  Filled 2018-03-28 (×2): qty 2

## 2018-03-28 MED ORDER — POTASSIUM CHLORIDE 10 MEQ/100ML IV SOLN
10.0000 meq | INTRAVENOUS | Status: AC
  Administered 2018-03-28 (×4): 10 meq via INTRAVENOUS
  Filled 2018-03-28 (×4): qty 100

## 2018-03-28 MED ORDER — CEFUROXIME AXETIL 250 MG PO TABS: 250.0000 mg | ORAL_TABLET | Freq: Two times a day (BID) | ORAL | 0 refills | Status: AC

## 2018-03-28 MED ORDER — ALBUTEROL SULFATE (2.5 MG/3ML) 0.083% IN NEBU: 3.0000 mL | INHALATION_SOLUTION | RESPIRATORY_TRACT | Status: DC | PRN

## 2018-03-28 MED ORDER — ONDANSETRON HCL 4 MG/2ML IJ SOLN
4.0000 mg | Freq: Four times a day (QID) | INTRAMUSCULAR | Status: DC | PRN
  Administered 2018-03-28: 4 mg via INTRAVENOUS
  Filled 2018-03-28: qty 2

## 2018-03-28 MED ORDER — ACETAMINOPHEN 325 MG PO TABS: 650.0000 mg | ORAL_TABLET | Freq: Four times a day (QID) | ORAL | Status: DC | PRN

## 2018-03-28 NOTE — Progress Notes (Signed)
Pt discharged per MD order. Discharge instructions reviewed with pt. Pt verbalized understanding and voiced that all her questions were answered to satisfaction. Prescriptions given to pt. Pt wheeled down by family in a wheelchair they are brought up from hospital lobby.

## 2018-03-28 NOTE — Clinical Social Work Note (Signed)
Consulted for patient needing assistance with meds. Please consult RN CM as she will address this with patient. York SpanielMonica Elize Pinon MSW,LCSW [phone]

## 2018-03-28 NOTE — H&P (Signed)
[facility] [place] at [facility]   PATIENT NAME: Carrie Marshall    MR#:  [id number]  DATE OF BIRTH:  [birth year]  DATE OF ADMISSION:  03/27/2018  PRIMARY CARE PHYSICIAN: [facility]   REQUESTING/REFERRING PHYSICIAN:   CHIEF COMPLAINT:   Chief Complaint  Patient presents with  . Shortness of Breath  . Diarrhea    HISTORY OF PRESENT ILLNESS: Carrie Marshall  is a 42 y.o. female with a known history of tobacco abuse, anxiety disorder, bipolar disorder, COPD and other comorbidities. Patient presented to emergency room for nausea, vomiting, diarrhea and right lower abdominal pain going on for the past 2 to 3 days, gradually getting worse.  The abdominal pain is described as severe, crampy in nature, improving with pain medication in emergency room.  Patient denies any fever or chills, no bleeding.  She denies any similar episodes in the past.  No recent travel.  She does not recall eating any unusual food, but she has had food from restaurants in the past week. Blood test done emergency room are notable for elevated WBC at 10.7, low potassium level at 3.  Glucose level is 150.  UA is positive for UTI.  Stool test is positive for Campylobacter infection. Abdominal CT scan shows pancolitis and proctitis. Patient is admitted for further evaluation and treatment.  PAST MEDICAL HISTORY:   Past Medical History:  Diagnosis Date  . Anxiety   . Asthma   . Bipolar disorder (HCC)   . COPD (chronic obstructive pulmonary disease) (HCC)   . Depression   . Dyspnea   . GERD (gastroesophageal reflux disease)   . Headache   . Seizures (HCC) 2008   R/T to drugs    PAST SURGICAL HISTORY:  Past Surgical History:  Procedure Laterality Date  . APPENDECTOMY    . LAPAROSCOPIC ASSISTED VAGINAL HYSTERECTOMY N/A 12/23/2016   Procedure: LAPAROSCOPIC ASSISTED VAGINAL HYSTERECTOMY;  Surgeon: Suzy Bouchard, MD;  Location: [facility] ORS;  Service:  Gynecology;  Laterality: N/A;  . LAPAROSCOPIC LYSIS OF ADHESIONS  03/24/2016   Procedure: LAPAROSCOPIC LYSIS OF ADHESIONS;  Surgeon: Suzy Bouchard, MD;  Location: [facility] OR[facility];  Service: Gynecology;;  . LAPAROSCOPIC UNILATERAL SALPINGECTOMY Left 12/23/2016   Procedure: LAPAROSCOPIC UNILATERAL SALPINGECTOMY;  Surgeon: Schermerhorn, Ihor Austin, MD;  Location: [facility] OR[facility];  Service: Gynecology;  Laterality: Left;  . LAPAROSCOPIC UNILATERAL SALPINGO OOPHERECTOMY Right 12/23/2016   Procedure: LAPAROSCOPIC UNILATERAL SALPINGO OOPHORECTOMY;  Surgeon: Schermerhorn, Ihor Austin, MD;  Location: [facility] ORS;  Service: Gynecology;  Laterality: Right;  . LAPAROSCOPY N/A 03/24/2016   Procedure: LAPAROSCOPY DIAGNOSTIC;  Surgeon: Suzy Bouchard, MD;  Location: [facility] OR[facility];  Service: Gynecology;  Laterality: N/A;  . LAPAROSCOPY N/A 03/24/2016   Procedure: LAPAROSCOPY OPERATIVE;  Surgeon: Suzy Bouchard, MD;  Location: [facility] OR[facility];  Service: Gynecology;  Laterality: N/A;  . TUBAL LIGATION      SOCIAL HISTORY:  Social History   Tobacco Use  . Smoking status: Current Every Day Smoker    Packs/day: 1.00    Types: Cigarettes  . Smokeless tobacco: Never Used  Substance Use Topics  . Alcohol use: No    FAMILY HISTORY:  Family History  Adopted: Yes    DRUG ALLERGIES:  Allergies  Allergen Reactions  . Gabapentin Shortness Of Breath    Chest pain  . Chantix [Varenicline] Nausea And Vomiting and Other (See Comments)    Crazy dreams    REVIEW OF SYSTEMS:   CONSTITUTIONAL: No fever, but positive  for fatigue and generalized weakness.  EYES: No changes in vision.  EARS, NOSE, AND THROAT: No tinnitus or ear pain.  RESPIRATORY: No cough, shortness of breath, wheezing or hemoptysis.  CARDIOVASCULAR: No chest pain, orthopnea, edema.  GASTROINTESTINAL: Positive for nausea, vomiting, diarrhea and abdominal pain.  GENITOURINARY: No dysuria, hematuria.  ENDOCRINE: No polyuria, nocturia. HEMATOLOGY: No  bleeding. SKIN: No rash or lesion. MUSCULOSKELETAL: No joint pain at this time.   NEUROLOGIC: No focal weakness.  PSYCHIATRY: No anxiety or depression.   MEDICATIONS AT HOME:  Prior to Admission medications   Medication Sig Start Date End Date Taking? Authorizing Provider  acetaminophen (TYLENOL) 500 MG tablet Take 1,000 mg by mouth every 6 (six) hours as needed for moderate pain or headache.   Yes [provider]  amitriptyline (ELAVIL) 10 MG tablet TAKE 1 TABLET BY MOUTH NIGHTLY AT BEDTIME FOR MOOD AND HEADACHES 01/11/18  Yes [provider]  fluticasone (FLONASE) 50 MCG/ACT nasal spray Place 1 spray into both nostrils 2 (two) times daily.   Yes [provider]  montelukast (SINGULAIR) 10 MG tablet Take 10 mg by mouth at bedtime.   Yes [provider]  omeprazole (PRILOSEC) 40 MG capsule Take 40 mg by mouth daily.   Yes [provider]  ondansetron (ZOFRAN) 4 MG tablet Take 1 tablet (4 mg total) by mouth every 6 (six) hours as needed for nausea. 12/24/16  Yes Schermerhorn, Ihor Austinhomas J, MD  PROAIR HFA 108 [phone](90 Base) MCG/ACT inhaler INHALE 2 PUFFS INTO THE LUNGS EVERY 4-6 HOURS AS NEEDED FOR WHEEZING. 11/01/16  Yes [provider]  ibuprofen (ADVIL,MOTRIN) 600 MG tablet Take 1 tablet (600 mg total) by mouth every 6 (six) hours as needed for fever or moderate pain. Patient not taking: Reported on 03/27/2018 01/28/17   Dionne BucySiadecki, Sebastian, MD  oxyCODONE-acetaminophen (PERCOCET/ROXICET) 5-325 MG tablet Take 1 tablet by mouth every 4 (four) hours as needed (moderate to severe pain (when tolerating fluids)). Patient not taking: Reported on 03/27/2018 12/24/16   Schermerhorn, Ihor Austinhomas J, MD      PHYSICAL EXAMINATION:   VITAL SIGNS: Blood pressure 105/69, pulse 78, temperature 98.6 F (37 C), temperature source Oral, resp. rate 17, height 5\' 7"  (1.702 m), weight 86.2 kg, last menstrual period 12/18/2016, SpO2 98 %.  GENERAL:  42 y.o.-year-old patient  lying in the bed with moderate distress, secondary to abdominal pain.  EYES: Pupils equal, round, reactive to light and accommodation. No scleral icterus. Extraocular muscles intact.  HEENT: Head atraumatic, normocephalic. Oropharynx and nasopharynx clear.  NECK:  Supple, no jugular venous distention. No thyroid enlargement, no tenderness.  LUNGS: Normal breath sounds bilaterally, no wheezing, rales,rhonchi or crepitation. No use of accessory muscles of respiration.  CARDIOVASCULAR: S1, S2 normal. No S3/S4.  ABDOMEN: There is tenderness with palpation at right lower quadrant, no rebound/guarding.  Otherwise, the abdomen is soft, nondistended. Bowel sounds present. No organomegaly or mass.  EXTREMITIES: No pedal edema, cyanosis, or clubbing.  NEUROLOGIC: Cranial nerves II through XII are intact. Muscle strength 5/5 in all extremities. Sensation intact.   PSYCHIATRIC: The patient is alert and oriented x 3.  SKIN: No obvious rash, lesion, or ulcer.   LABORATORY PANEL:   CBC Recent Labs  Lab 03/27/18 1851  WBC 10.7*  HGB 14.3  HCT 44.6  PLT 230  MCV 80.4  MCH 25.8*  MCHC 32.1  RDW 14.0   ------------------------------------------------------------------------------------------------------------------  Chemistries  Recent Labs  Lab 03/27/18 1851  NA 134*  K 3.0*  CL  103  CO2 22  GLUCOSE 150*  BUN 11  CREATININE 0.74  CALCIUM 8.3*  AST 17  ALT 15  ALKPHOS 93  BILITOT 0.4   ------------------------------------------------------------------------------------------------------------------ estimated creatinine clearance is 105.4 mL/min (by C-G formula based on SCr of 0.74 mg/dL). ------------------------------------------------------------------------------------------------------------------ No results for input(s): TSH, T4TOTAL, T3FREE, THYROIDAB in the last 72 hours.  Invalid input(s): FREET3   Coagulation profile No results for input(s): INR, PROTIME in the last 168  hours. ------------------------------------------------------------------------------------------------------------------- No results for input(s): DDIMER in the last 72 hours. -------------------------------------------------------------------------------------------------------------------  Cardiac Enzymes Recent Labs  Lab 03/27/18 1851  TROPONINI <0.03   ------------------------------------------------------------------------------------------------------------------ Invalid input(s): POCBNP  ---------------------------------------------------------------------------------------------------------------  Urinalysis    Component Value Date/Time   COLORURINE YELLOW (A) 03/27/2018 2019   APPEARANCEUR HAZY (A) 03/27/2018 2019   LABSPEC 1.014 03/27/2018 2019   PHURINE 6.0 03/27/2018 2019   GLUCOSEU NEGATIVE 03/27/2018 2019   HGBUR MODERATE (A) 03/27/2018 2019   BILIRUBINUR NEGATIVE 03/27/2018 2019   KETONESUR NEGATIVE 03/27/2018 2019   PROTEINUR NEGATIVE 03/27/2018 2019   NITRITE NEGATIVE 03/27/2018 2019   LEUKOCYTESUR LARGE (A) 03/27/2018 2019     RADIOLOGY: Ct Abdomen Pelvis W Contrast  Result Date: 03/27/2018 CLINICAL DATA:  Dyspnea, diarrhea and abdominal pain x2 days. History of tubal ligation, appendectomy and hysterectomy. EXAM: CT ABDOMEN AND PELVIS WITH CONTRAST TECHNIQUE: Multidetector CT imaging of the abdomen and pelvis was performed using the standard protocol following bolus administration of intravenous contrast. CONTRAST:  ISOVUE-300 IOPAMIDOL (ISOVUE-300) INJECTION 61% COMPARISON:  02/29/2016 FINDINGS: Lower chest: No acute abnormality. Hepatobiliary: No focal liver abnormality is seen. No gallstones, gallbladder wall thickening, or biliary dilatation. Pancreas: Unremarkable. No pancreatic ductal dilatation or surrounding inflammatory changes. Spleen: Normal in size without focal abnormality. Adrenals/Urinary Tract: Adrenal glands are unremarkable. Kidneys are  normal, without renal calculi, focal lesion, or hydronephrosis. Bladder is unremarkable. Stomach/Bowel: Diffuse mucosal enhancement and mild transmural thickening of the colon from cecum to rectum consistent with pancolitis and proctitis. No bowel perforation or obstruction. Small bowel loops are unremarkable. Vascular/Lymphatic: No significant vascular findings are present. No enlarged abdominal or pelvic lymph nodes. Reproductive: Status post hysterectomy. No adnexal masses. Other: Tiny periumbilical fat containing hernia. No abdominopelvic ascites. Musculoskeletal: No acute or significant osseous findings. IMPRESSION: Pancolitis and proctitis. Electronically Signed   By: Tollie Eth M.D.   On: 03/27/2018 22:45   Dg Chest Portable 1 View  Result Date: 03/27/2018 CLINICAL DATA:  Shortness of breath.  Diarrhea.  Chest pain. EXAM: PORTABLE CHEST 1 VIEW COMPARISON:  January 27, 2017 FINDINGS: The heart size and mediastinal contours are within normal limits. Both lungs are clear. The visualized skeletal structures are unremarkable. IMPRESSION: No active disease. Electronically Signed   By: Gerome Sam III M.D   On: 03/27/2018 20:07    EKG: Orders placed or performed during the hospital encounter of 03/27/18  . EKG 12-Lead  . EKG 12-Lead  . ED EKG  . ED EKG    IMPRESSION AND PLAN:  1.  Acute Campylobacter pancolitis and proctitis.  We will start treatment with azithromycin.  We will keep patient n.p.o. for now.  Continue supportive measures with IV fluids, pain meds and antinausea medications. 2.  Acute UTI.  Will treat with ceftriaxone IV while waiting for urine culture result. 3.  Hypokalemia, secondary to diarrhea.  Will replace potassium per protocol. 4.  COPD, without acute exacerbation.  Continue maintenance therapy. 5.  Anxiety disorder, stable continue home medications. 6.  Tobacco abuse.  Smoking cessation was  discussed with patient in detail.  All the records are reviewed and  case discussed with ED provider. Management plans discussed with the patient, family and they are in agreement.  CODE STATUS: Full Code Status History    Date Active Date Inactive Code Status Order ID Comments User Context   12/23/2016 1052 12/24/2016 1422 Full Code [id number]  Schermerhorn, Ihor Austin, MD Inpatient       TOTAL TIME TAKING CARE OF THIS PATIENT: 50 minutes.    Cammy Copa M.D on 03/28/2018 at 12:38 AM  Between 7am to 6pm - Pager - [phone]  After 6pm go to [email or web address] - password [email or web address] [facility] [place] at [facility]  [phone]  CC: Primary care physician; [facility]

## 2018-03-28 NOTE — Progress Notes (Signed)
Electrolyte replacement  Afternoon K+ 3.0. KCl 40 mEq IV total ordered. Recheck BMP and Mg with tomorrow AM labs.  Fulton ReekMatt Tremane Spurgeon, PharmD, BCPS  03/28/18 1:26 AM

## 2018-03-28 NOTE — Progress Notes (Signed)
Pharmacy Electrolyte Monitoring Consult:  Pharmacy consulted to assist in monitoring and replacing electrolytes in this 42 y.o. female admitted on 03/27/2018 with Shortness of Breath and Diarrhea Stool: + Campylobacter  Labs:  Sodium (mmol/L)  Date Value  03/28/2018 138   Potassium (mmol/L)  Date Value  03/28/2018 3.2 (L)   Magnesium (mg/dL)  Date Value  16/10/960411/13/2019 2.0   Calcium (mg/dL)  Date Value  54/09/811911/13/2019 7.1 (L)   Albumin (g/dL)  Date Value  14/78/295611/04/2018 3.5    Assessment/Plan: K 3.2  Mag 2.0 Will order Potassium Chloride 10 meq IV x 3 doses (30 meq total) Will recheck K+ at 1800 and f/u with am labs   Carrie Marshall A 03/28/2018 7:14 AM

## 2018-03-29 LAB — HIV ANTIBODY (ROUTINE TESTING W REFLEX): HIV SCREEN 4TH GENERATION: NONREACTIVE

## 2018-03-29 LAB — URINE CULTURE

## 2018-04-03 NOTE — Discharge Summary (Signed)
[facility] [place] NAME: Carrie Marshall    MR#:  [id number]  DATE OF BIRTH:  [birth year]  DATE OF ADMISSION:  03/27/2018 ADMITTING PHYSICIAN: Amelia Jo, MD  DATE OF DISCHARGE: 03/28/2018  2:43 PM  PRIMARY CARE PHYSICIAN: [facility] [place] DIAGNOSIS:  Colitis [K52.9] Diarrhea of infectious origin [A09] Campylobacter diarrhea [A04.5]  DISCHARGE DIAGNOSIS:  Active Problems:   Pancolitis ([place])   SECONDARY DIAGNOSIS:   Past Medical History:  Diagnosis Date  . Anxiety   . Asthma   . Bipolar disorder ([place])   . COPD (chronic obstructive pulmonary disease) ([place])   . Depression   . Dyspnea   . GERD (gastroesophageal reflux disease)   . Headache   . Seizures ([place]) 2008   R/T to drugs    HOSPITAL COURSE:   1.  Acute Campylobacter pancolitis and proctitis.  We will start treatment with azithromycin.  We will keep patient n.p.o. for now.  Continue supportive measures with IV fluids, pain meds and antinausea medications. 2.  Acute UTI.  ceftriaxone IV while waiting for urine culture result. 3.  Hypokalemia, secondary to diarrhea.  Will replace potassium per protocol. 4.  COPD, without acute exacerbation.  Continue maintenance therapy. 5.  Anxiety disorder, stable continue home medications. 6.  Tobacco abuse.  Smoking cessation was discussed with patient in detail.  Pt improved and tolerated diet well. Discharged later in the day  DISCHARGE CONDITIONS:   Stable.  CONSULTS OBTAINED:    DRUG ALLERGIES:   Allergies  Allergen Reactions  . Gabapentin Shortness Of Breath    Chest pain  . Chantix [Varenicline] Nausea And Vomiting and Other (See Comments)    Crazy dreams    DISCHARGE MEDICATIONS:   Allergies as of 03/28/2018      Reactions   Gabapentin Shortness Of Breath   Chest pain   Chantix [varenicline] Nausea And Vomiting, Other (See Comments)   Crazy dreams       Medication List    STOP taking these medications   ibuprofen 600 MG tablet Commonly known as:  ADVIL,MOTRIN   oxyCODONE-acetaminophen 5-325 MG tablet Commonly known as:  PERCOCET/ROXICET     TAKE these medications   acetaminophen 500 MG tablet Commonly known as:  TYLENOL Take 1,000 mg by mouth every 6 (six) hours as needed for moderate pain or headache.   amitriptyline 10 MG tablet Commonly known as:  ELAVIL TAKE 1 TABLET BY MOUTH NIGHTLY AT BEDTIME FOR MOOD AND HEADACHES   fluticasone 50 MCG/ACT nasal spray Commonly known as:  FLONASE Place 1 spray into both nostrils 2 (two) times daily.   HYDROcodone-acetaminophen 5-325 MG tablet Commonly known as:  NORCO/VICODIN Take 1 tablet by mouth every 6 (six) hours as needed for moderate pain or severe pain.   montelukast 10 MG tablet Commonly known as:  SINGULAIR Take 10 mg by mouth at bedtime.   omeprazole 40 MG capsule Commonly known as:  PRILOSEC Take 40 mg by mouth daily.   ondansetron 4 MG tablet Commonly known as:  ZOFRAN Take 1 tablet (4 mg total) by mouth every 6 (six) hours as needed for nausea.   PROAIR HFA 108 (90 Base) MCG/ACT inhaler Generic drug:  albuterol INHALE 2 PUFFS INTO THE LUNGS EVERY 4-6 HOURS AS NEEDED FOR WHEEZING.     ASK your doctor about these medications   azithromycin 250 MG tablet Commonly known as:  ZITHROMAX Take 1 tablet (250 mg  total) by mouth daily for 3 days. Take 2 tablets (500 mg) on  Day 1,  followed by 1 tablet (250 mg) once daily on Days 2 through 5. Ask about: Should I take this medication?   cefUROXime 250 MG tablet Commonly known as:  CEFTIN Take 1 tablet (250 mg total) by mouth 2 (two) times daily for 3 days. Ask about: Should I take this medication?        DISCHARGE INSTRUCTIONS:    Follow with PMD.  If you experience worsening of your admission symptoms, develop shortness of breath, life threatening emergency, suicidal or homicidal thoughts you must seek  medical attention immediately by calling 911 or calling your MD immediately  if symptoms less severe.  You Must read complete instructions/literature along with all the possible adverse reactions/side effects for all the Medicines you take and that have been prescribed to you. Take any new Medicines after you have completely understood and accept all the possible adverse reactions/side effects.   Please note  You were cared for by a hospitalist during your hospital stay. If you have any questions about your discharge medications or the care you received while you were in the hospital after you are discharged, you can call the unit and asked to speak with the hospitalist on call if the hospitalist that took care of you is not available. Once you are discharged, your primary care physician will handle any further medical issues. Please note that NO REFILLS for any discharge medications will be authorized once you are discharged, as it is imperative that you return to your primary care physician (or establish a relationship with a primary care physician if you do not have one) for your aftercare needs so that they can reassess your need for medications and monitor your lab values.    Today   CHIEF COMPLAINT:   Chief Complaint  Patient presents with  . Shortness of Breath  . Diarrhea    HISTORY OF PRESENT ILLNESS:  Carrie Marshall  is a 42 y.o. female with a known history of tobacco abuse, anxiety disorder, bipolar disorder, COPD and other comorbidities. Patient presented to emergency room for nausea, vomiting, diarrhea and right lower abdominal pain going on for the past 2 to 3 days, gradually getting worse.  The abdominal pain is described as severe, crampy in nature, improving with pain medication in emergency room.  Patient denies any fever or chills, no bleeding.  She denies any similar episodes in the past.  No recent travel.  She does not recall eating any unusual food, but she has had food  from restaurants in the past week. Blood test done emergency room are notable for elevated WBC at 10.7, low potassium level at 3.  Glucose level is 150.  UA is positive for UTI.  Stool test is positive for Campylobacter infection. Abdominal CT scan shows pancolitis and proctitis. Patient is admitted for further evaluation and treatment.  VITAL SIGNS:  Blood pressure (!) 90/55, pulse 70, temperature 98.3 F (36.8 C), temperature source Oral, resp. rate 16, height 5' 7"  (1.702 m), weight 84.5 kg, last menstrual period 12/18/2016, SpO2 97 %.  I/O:  No intake or output data in the 24 hours ending 04/03/18 1003  PHYSICAL EXAMINATION:  GENERAL:  42 y.o.-year-old patient lying in the bed with no acute distress.  EYES: Pupils equal, round, reactive to light and accommodation. No scleral icterus. Extraocular muscles intact.  HEENT: Head atraumatic, normocephalic. Oropharynx and nasopharynx clear.  NECK:  Supple, no  jugular venous distention. No thyroid enlargement, no tenderness.  LUNGS: Normal breath sounds bilaterally, no wheezing, rales,rhonchi or crepitation. No use of accessory muscles of respiration.  CARDIOVASCULAR: S1, S2 normal. No murmurs, rubs, or gallops.  ABDOMEN: Soft, non-tender, non-distended. Bowel sounds present. No organomegaly or mass.  EXTREMITIES: No pedal edema, cyanosis, or clubbing.  NEUROLOGIC: Cranial nerves II through XII are intact. Muscle strength 5/5 in all extremities. Sensation intact. Gait not checked.  PSYCHIATRIC: The patient is alert and oriented x 3.  SKIN: No obvious rash, lesion, or ulcer.   DATA REVIEW:   CBC Recent Labs  Lab 03/28/18 0424  WBC 7.0  HGB 12.0  HCT 37.5  PLT 180    Chemistries  Recent Labs  Lab 03/27/18 1851 03/28/18 0424 03/28/18 1004  NA 134* 138  --   K 3.0* 3.2* 3.2*  CL 103 107  --   CO2 22 24  --   GLUCOSE 150* 98  --   BUN 11 8  --   CREATININE 0.74 0.61  --   CALCIUM 8.3* 7.1*  --   MG  --  2.0  --   AST 17  --    --   ALT 15  --   --   ALKPHOS 93  --   --   BILITOT 0.4  --   --     Cardiac Enzymes Recent Labs  Lab 03/27/18 1851  TROPONINI <0.03    Microbiology Results  Results for orders placed or performed during the hospital encounter of 03/27/18  C difficile quick scan w PCR reflex     Status: None   Collection Time: 03/27/18  8:19 PM  Result Value Ref Range Status   C Diff antigen NEGATIVE NEGATIVE Final   C Diff toxin NEGATIVE NEGATIVE Final   C Diff interpretation No C. difficile detected.  Final    Comment: Performed at [facility], [address], [place], [place] [address]  Urine Culture     Status: None   Collection Time: 03/27/18  8:19 PM  Result Value Ref Range Status   Specimen Description   Final    URINE, RANDOM Performed at [facility], [address], [place], [place] [address]    Special Requests   Final    NONE Performed at [facility], [address] [place],  [address]    Culture   Final    Multiple bacterial morphotypes present, none predominant. Suggest appropriate recollection if clinically indicated.   Report Status 03/29/2018 FINAL  Final  Gastrointestinal Panel by PCR , Stool     Status: Abnormal   Collection Time: 03/27/18  8:20 PM  Result Value Ref Range Status   Campylobacter species DETECTED (A) NOT DETECTED Final    Comment: RESULT CALLED TO, READ BACK BY AND VERIFIED WITH: BUTCH WOODS 03/27/18 2202 REC    Plesimonas shigelloides NOT DETECTED NOT DETECTED Final   Salmonella species NOT DETECTED NOT DETECTED Final   Yersinia enterocolitica NOT DETECTED NOT DETECTED Final   Vibrio species NOT DETECTED NOT DETECTED Final   Vibrio cholerae NOT DETECTED NOT DETECTED Final   Enteroaggregative E coli (EAEC) NOT DETECTED NOT DETECTED Final   Enteropathogenic E coli (EPEC) NOT DETECTED NOT DETECTED Final   Enterotoxigenic E coli (ETEC) NOT DETECTED NOT DETECTED Final   Shiga like toxin producing E coli (STEC)  NOT DETECTED NOT DETECTED Final   Shigella/Enteroinvasive E coli (EIEC) NOT DETECTED NOT DETECTED Final   Cryptosporidium  NOT DETECTED NOT DETECTED Final   Cyclospora cayetanensis NOT DETECTED NOT DETECTED Final   Entamoeba histolytica NOT DETECTED NOT DETECTED Final   Giardia lamblia NOT DETECTED NOT DETECTED Final   Adenovirus F40/41 NOT DETECTED NOT DETECTED Final   Astrovirus NOT DETECTED NOT DETECTED Final   Norovirus GI/GII NOT DETECTED NOT DETECTED Final   Rotavirus A NOT DETECTED NOT DETECTED Final   Sapovirus (I, II, IV, and V) NOT DETECTED NOT DETECTED Final    Comment: Performed at [facility], [address] [place], [place] [address]    RADIOLOGY:  No results found.  EKG:   Orders placed or performed during the hospital encounter of 03/27/18  . EKG 12-Lead  . EKG 12-Lead  . ED EKG  . ED EKG  . EKG      Management plans discussed with the patient, family and they are in agreement.  CODE STATUS:  Code Status History    Date Active Date Inactive Code Status Order ID Comments User Context   03/28/2018 0114 03/28/2018 1749 Full Code [id number]  Amelia Jo, MD Inpatient   12/23/2016 1052 12/24/2016 1422 Full Code [id number]  Schermerhorn, Gwen Her, MD Inpatient      TOTAL TIME TAKING CARE OF THIS PATIENT: 35 minutes.    Vaughan Basta M.D on 04/03/2018 at 10:03 AM  Between 7am to 6pm - Pager - [phone]  After 6pm go to [email or web address] - password [email or web address]  Office  [phone]  CC: Primary care physician; [facility] [place]   Note: This dictation was prepared with Diplomatic Services operational officer dictation along with smaller phrase technology. Any transcriptional errors that result from this process are unintentional.

## 2018-04-07 IMAGING — CT CT RENAL STONE PROTOCOL
3 of 4 series · 9 of 46 positions shown, 16 images · non-contrast
Comparison: None.

CLINICAL DATA: Right flank pain for 2 days.  Hematuria.

EXAM:
CT ABDOMEN AND PELVIS WITHOUT CONTRAST
TECHNIQUE: Multidetector CT imaging of the abdomen and pelvis was performed
following the standard protocol without IV contrast.

[Series 4: lung · axial · 0.66mm/px · z∈[-186,-106]mm · 5 of 26 slices shown, 10 images]
[im 5/26  soft-tissue]
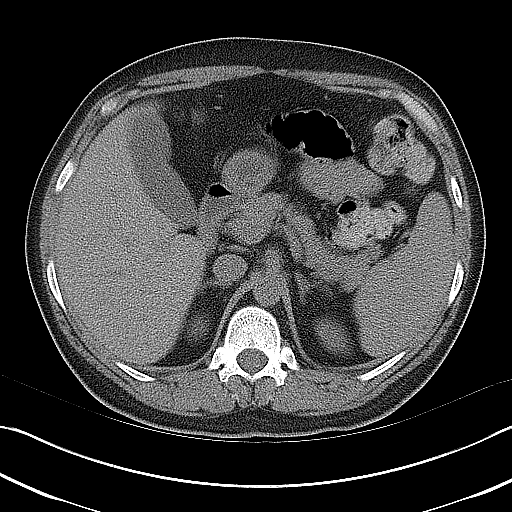
[im 5/26  bone]
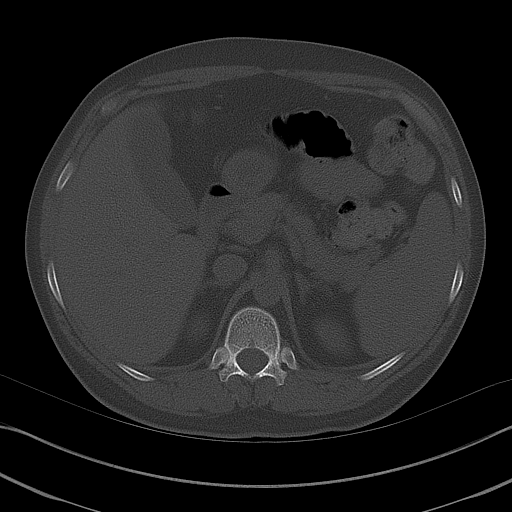
[im 9/26  soft-tissue]
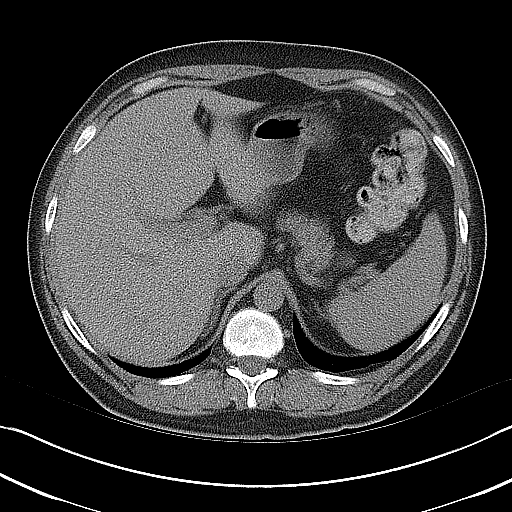
[im 9/26  lung]
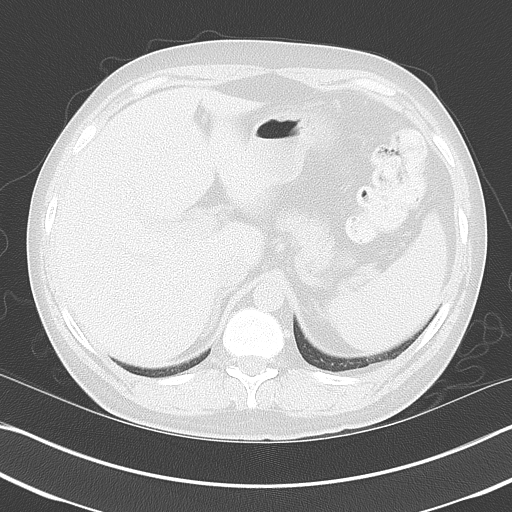
[im 13/26  soft-tissue]
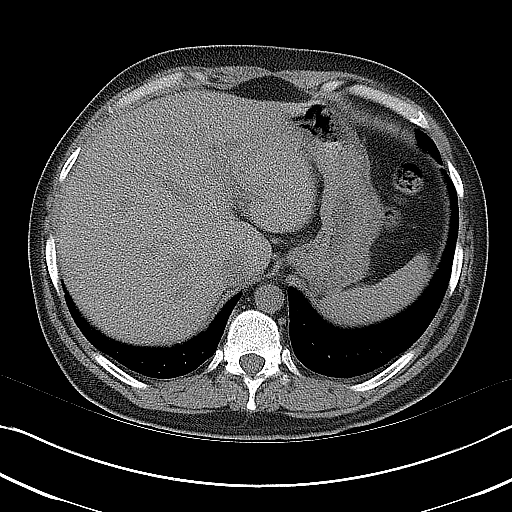
[im 13/26  lung]
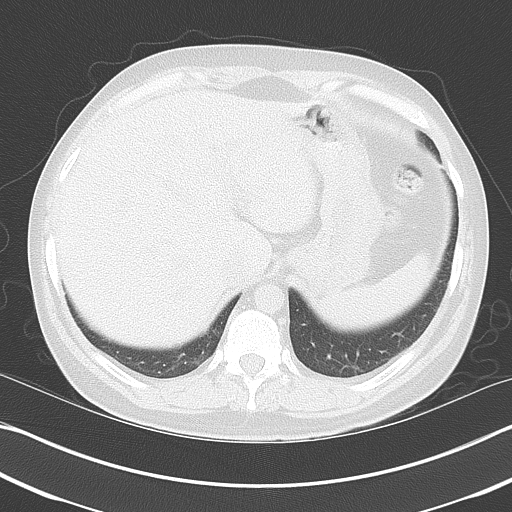
[im 17/26  soft-tissue]
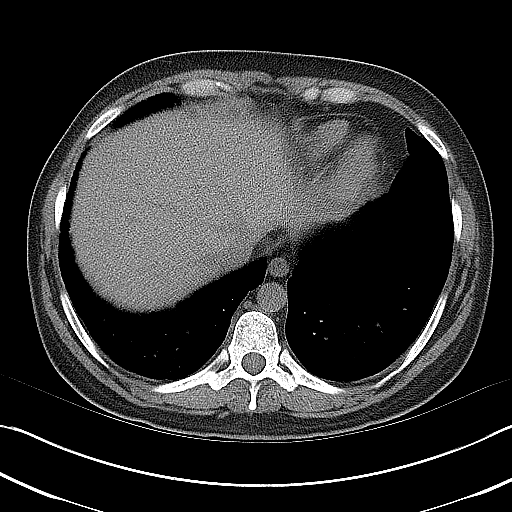
[im 17/26  lung]
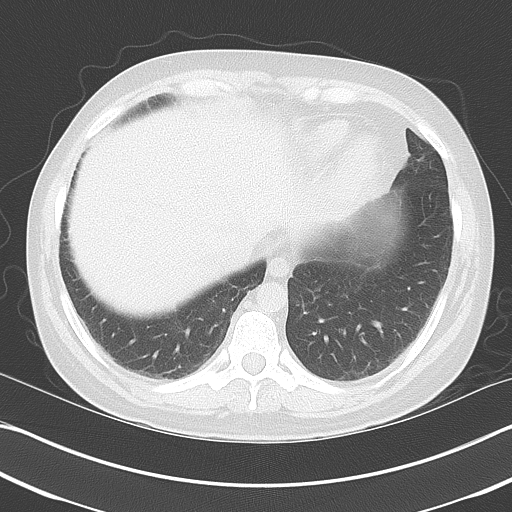
[im 21/26  soft-tissue]
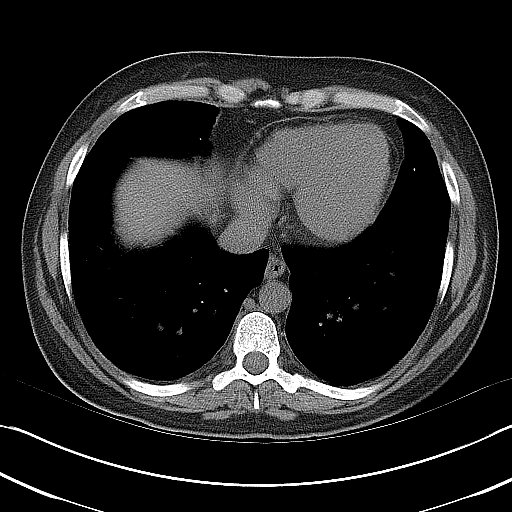
[im 21/26  lung]
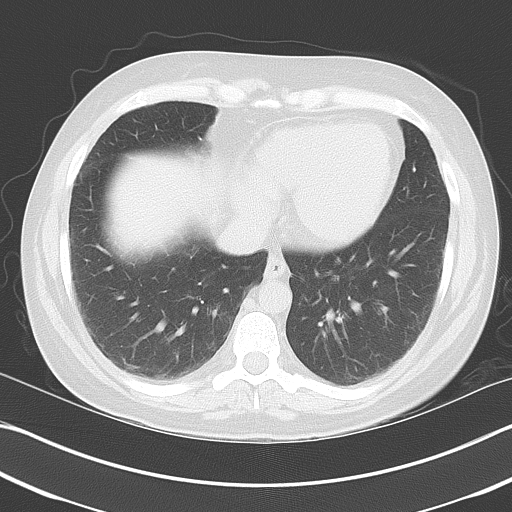

[Series 5: coronal · coronal · 0.63mm/px · 3 of 117 slices shown, 4 images]
[im 39/117  soft-tissue]
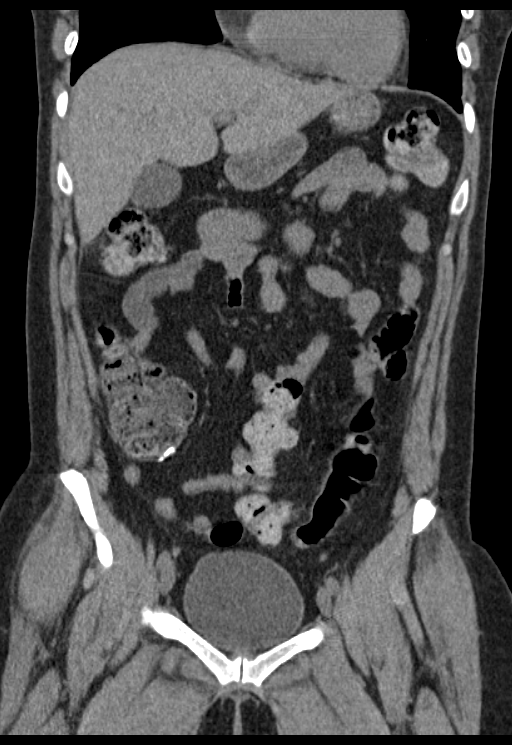
[im 52/117  soft-tissue]
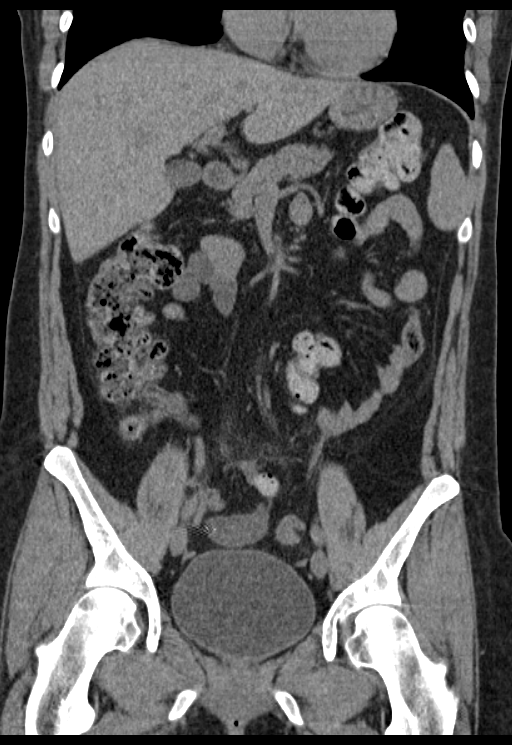
[im 52/117  bone]
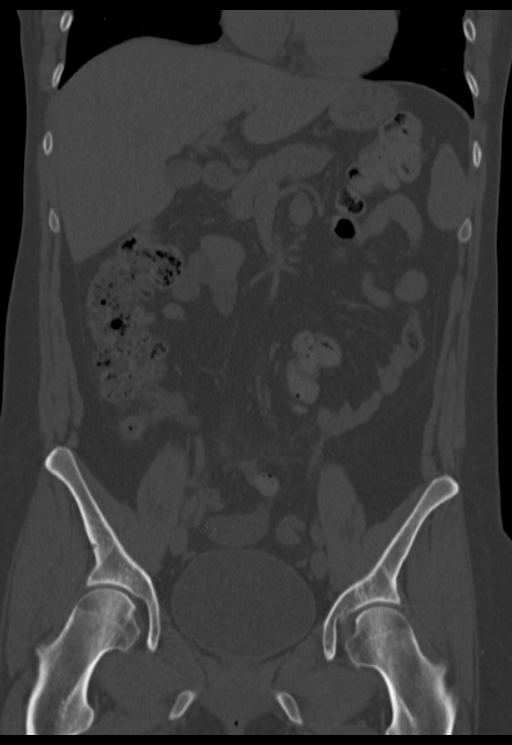
[im 65/117  soft-tissue]
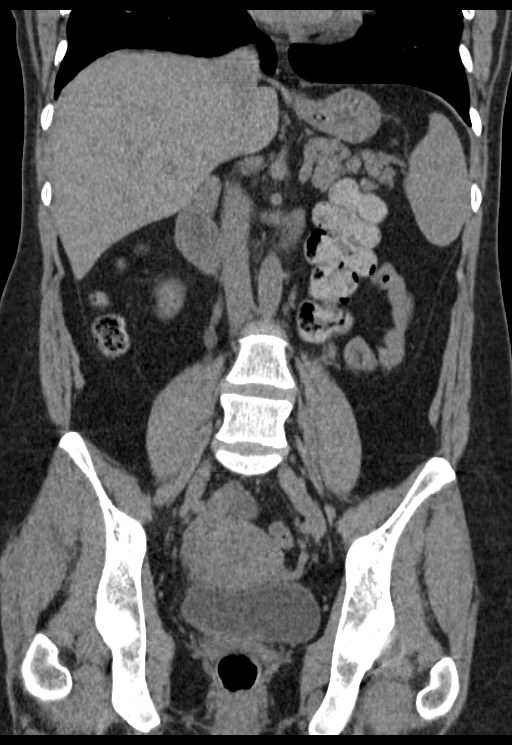

[Series 6: sagittal · sagittal · 0.47mm/px · 1 of 156 slices shown, 2 images]
[im 52/156  soft-tissue]
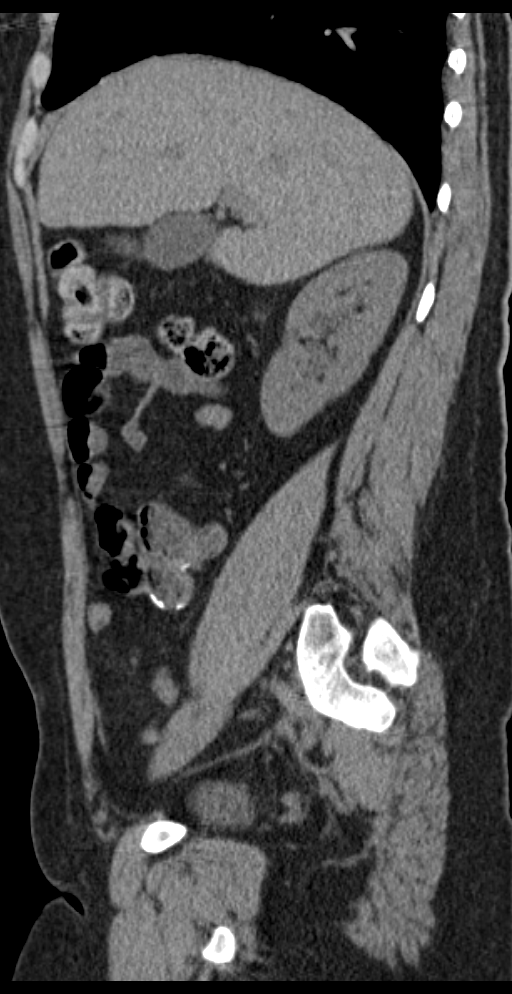
[im 52/156  bone]
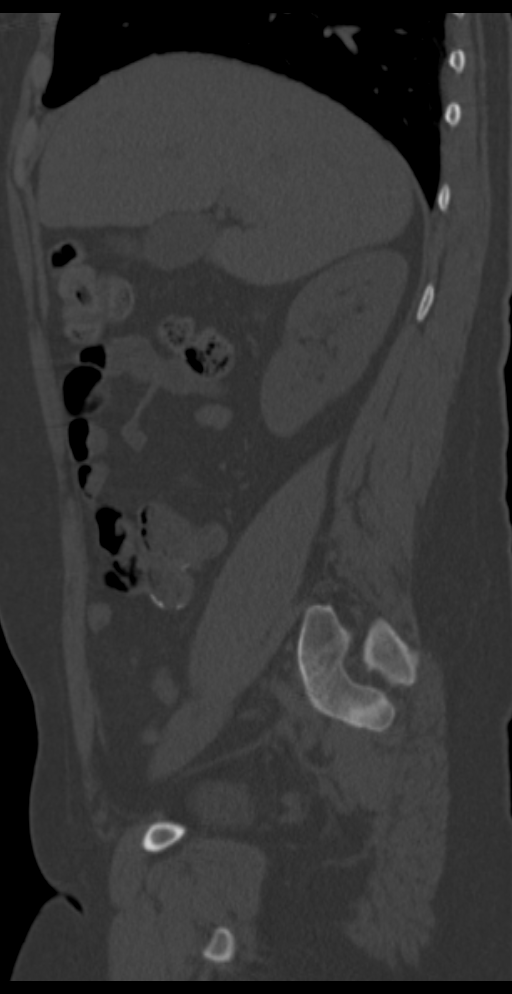

[9 of 46 positions shown; findings below may reference images not displayed]

FINDINGS: Mild dependent atelectasis in the lung bases.

The kidneys appear symmetrical in size and shape. No hydronephrosis
or hydroureter. No renal, ureteral, or bladder stones. No bladder
wall thickening.

The unenhanced appearance of the liver, spleen, gallbladder,
pancreas, adrenal glands, abdominal aorta, inferior vena cava, and
retroperitoneal lymph nodes is unremarkable.

Stomach, small bowel, and colon are mostly decompressed although
stool fills the colon. No free air or free fluid in the abdomen.

Pelvis: Surgical absence of the appendix. Surgical clips in the
pelvis consistent with tubal ligations. Uterus and ovaries are not
enlarged. No free or loculated pelvic fluid collections. The no
destructive bone lesions.
IMPRESSION: No renal or ureteral stone or obstruction demonstrated.

## 2018-06-05 ENCOUNTER — Other Ambulatory Visit: Payer: Self-pay

## 2018-06-05 ENCOUNTER — Emergency Department
Admission: EM | Admit: 2018-06-05 | Discharge: 2018-06-05 | Disposition: A | Discharge status: Home / Self Care | Payer: Medicaid Other | Attending: Emergency Medicine | Admitting: Emergency Medicine

## 2018-06-05 DIAGNOSIS — J449 Chronic obstructive pulmonary disease, unspecified: Secondary | ICD-10-CM | POA: Insufficient documentation

## 2018-06-05 DIAGNOSIS — J029 Acute pharyngitis, unspecified: Secondary | ICD-10-CM | POA: Diagnosis present

## 2018-06-05 DIAGNOSIS — F17210 Nicotine dependence, cigarettes, uncomplicated: Secondary | ICD-10-CM | POA: Insufficient documentation

## 2018-06-05 DIAGNOSIS — Z79899 Other long term (current) drug therapy: Secondary | ICD-10-CM | POA: Diagnosis not present

## 2018-06-05 DIAGNOSIS — J020 Streptococcal pharyngitis: Secondary | ICD-10-CM | POA: Insufficient documentation

## 2018-06-05 LAB — GROUP A STREP BY PCR: GROUP A STREP BY PCR: DETECTED — AB

## 2018-06-05 MED ORDER — PENICILLIN V POTASSIUM 500 MG PO TABS
500.0000 mg | ORAL_TABLET | Freq: Once | ORAL | Status: AC
  Administered 2018-06-05: 500 mg via ORAL
  Filled 2018-06-05: qty 1

## 2018-06-05 MED ORDER — LIDOCAINE VISCOUS HCL 2 % MT SOLN
15.0000 mL | Freq: Once | OROMUCOSAL | Status: AC
  Administered 2018-06-05: 15 mL via OROMUCOSAL
  Filled 2018-06-05: qty 15

## 2018-06-05 MED ORDER — DEXAMETHASONE SODIUM PHOSPHATE 10 MG/ML IJ SOLN
10.0000 mg | Freq: Once | INTRAMUSCULAR | Status: AC
  Administered 2018-06-05: 10 mg via INTRAMUSCULAR
  Filled 2018-06-05: qty 1

## 2018-06-05 MED ORDER — PENICILLIN V POTASSIUM 500 MG PO TABS: 500.0000 mg | ORAL_TABLET | Freq: Three times a day (TID) | ORAL | 0 refills | Status: DC

## 2018-06-05 MED ORDER — LIDOCAINE VISCOUS HCL 2 % MT SOLN: 10.0000 mL | OROMUCOSAL | 0 refills | Status: DC | PRN

## 2018-06-05 NOTE — ED Triage Notes (Addendum)
Pt comes via POV from home with c/o sore throat and fever that started Saturday. Pt states she has taken OTC medications with no relief.  Pt states highest fever of 103 Sunday. Pt also states bilateral ear pain.

## 2018-06-05 NOTE — ED Provider Notes (Signed)
[facility] Emergency Department Provider Note  ____________________________________________  Time seen: Approximately 5:01 PM  I have reviewed the triage vital signs and the nursing notes.   HISTORY  Chief Complaint Sore Throat and Fever    HPI Carrie Marshall is a 43 y.o. female that presents to the emergency department for evaluation of fever, chills, body aches, sore throat for 4 days.  Patient states that she initially thought she had the flu so was taking over-the-counter cold medications.  She states that throughout now feels like strep that she has had in the past.  She describes the pain as razor blades in her throat.  Fever has been as high as 103.  No sick contacts.  Past Medical History:  Diagnosis Date  . Anxiety   . Asthma   . Bipolar disorder ([facility])   . COPD (chronic obstructive pulmonary disease) ([facility])   . Depression   . Dyspnea   . GERD (gastroesophageal reflux disease)   . Headache   . Seizures ([facility]) 2008   R/T to drugs    Patient Active Problem List   Diagnosis Date Noted  . Pancolitis ([facility]) 03/28/2018  . Post-operative state 12/23/2016    Past Surgical History:  Procedure Laterality Date  . APPENDECTOMY    . LAPAROSCOPIC ASSISTED VAGINAL HYSTERECTOMY N/A 12/23/2016   Procedure: LAPAROSCOPIC ASSISTED VAGINAL HYSTERECTOMY;  Surgeon: Suzy BouchardSchermerhorn, Thomas J, MD;  Location: [facility] ORS;  Service: Gynecology;  Laterality: N/A;  . LAPAROSCOPIC LYSIS OF ADHESIONS  03/24/2016   Procedure: LAPAROSCOPIC LYSIS OF ADHESIONS;  Surgeon: Suzy Bouchardhomas J Schermerhorn, MD;  Location: [facility] ORS;  Service: Gynecology;;  . LAPAROSCOPIC UNILATERAL SALPINGECTOMY Left 12/23/2016   Procedure: LAPAROSCOPIC UNILATERAL SALPINGECTOMY;  Surgeon: Schermerhorn, Ihor Austinhomas J, MD;  Location: [facility] ORS;  Service: Gynecology;  Laterality: Left;  . LAPAROSCOPIC UNILATERAL SALPINGO OOPHERECTOMY Right 12/23/2016   Procedure: LAPAROSCOPIC UNILATERAL SALPINGO OOPHORECTOMY;  Surgeon:  Schermerhorn, Ihor Austinhomas J, MD;  Location: [facility] ORS;  Service: Gynecology;  Laterality: Right;  . LAPAROSCOPY N/A 03/24/2016   Procedure: LAPAROSCOPY DIAGNOSTIC;  Surgeon: Suzy Bouchardhomas J Schermerhorn, MD;  Location: [facility] ORS;  Service: Gynecology;  Laterality: N/A;  . LAPAROSCOPY N/A 03/24/2016   Procedure: LAPAROSCOPY OPERATIVE;  Surgeon: Suzy Bouchardhomas J Schermerhorn, MD;  Location: [facility] ORS;  Service: Gynecology;  Laterality: N/A;  . TUBAL LIGATION      Prior to Admission medications   Medication Sig Start Date End Date Taking? Authorizing Provider  acetaminophen (TYLENOL) 500 MG tablet Take 1,000 mg by mouth every 6 (six) hours as needed for moderate pain or headache.    [provider]  amitriptyline (ELAVIL) 10 MG tablet TAKE 1 TABLET BY MOUTH NIGHTLY AT BEDTIME FOR MOOD AND HEADACHES 01/11/18   [provider]  fluticasone (FLONASE) 50 MCG/ACT nasal spray Place 1 spray into both nostrils 2 (two) times daily.    [provider]  HYDROcodone-acetaminophen (NORCO/VICODIN) 5-325 MG tablet Take 1 tablet by mouth every 6 (six) hours as needed for moderate pain or severe pain. 03/28/18   Altamese DillingVachhani, Vaibhavkumar, MD  lidocaine (XYLOCAINE) 2 % solution Use as directed 10 mLs in the mouth or throat as needed for mouth pain. 06/05/18   Enid DerryWagner, Rock Sobol, PA-C  montelukast (SINGULAIR) 10 MG tablet Take 10 mg by mouth at bedtime.    [provider]  omeprazole (PRILOSEC) 40 MG capsule Take 40 mg by mouth daily.    [provider]  ondansetron (ZOFRAN) 4 MG tablet Take 1 tablet (4 mg total) by mouth every 6 (six)  hours as needed for nausea. 03/28/18   Altamese Dilling, MD  penicillin v potassium (VEETID) 500 MG tablet Take 1 tablet (500 mg total) by mouth 3 (three) times daily. 06/05/18   Enid Derry, PA-C  PROAIR HFA 108 [phone] Base) MCG/ACT inhaler INHALE 2 PUFFS INTO THE LUNGS EVERY 4-6 HOURS AS NEEDED FOR WHEEZING. 11/01/16   [provider]    Allergies Gabapentin  and Chantix [varenicline]  Family History  Adopted: Yes    Social History Social History   Tobacco Use  . Smoking status: Current Every Day Smoker    Packs/day: 1.00    Types: Cigarettes  . Smokeless tobacco: Never Used  Substance Use Topics  . Alcohol use: No  . Drug use: Yes    Types: Marijuana    Comment: In the past--none since 2012 -crack   mj- 3 weeks ago     Review of Systems  Constitutional: Positive for fever Eyes: No visual changes. No discharge. ENT: Negative for congestion and rhinorrhea. Cardiovascular: No chest pain. Respiratory: Negative for cough. No SOB. Gastrointestinal: No abdominal pain.  No nausea, no vomiting.  Musculoskeletal: Positive for body aches. Skin: Negative for rash, abrasions, lacerations, ecchymosis. Neurological: Negative for headaches.   ____________________________________________   PHYSICAL EXAM:  VITAL SIGNS: ED Triage Vitals  Enc Vitals Group     BP 06/05/18 1613 108/71     Pulse Rate 06/05/18 1613 85     Resp 06/05/18 1613 18     Temp 06/05/18 1613 98.7 F (37.1 C)     Temp src --      SpO2 06/05/18 1613 97 %     Weight 06/05/18 1609 220 lb (99.8 kg)     Height 06/05/18 1609 5\' 6"  (1.676 m)     Head Circumference --      Peak Flow --      Pain Score 06/05/18 1609 10     Pain Loc --      Pain Edu? --      Excl. in GC? --      Constitutional: Alert and oriented. Well appearing and in no acute distress. Eyes: Conjunctivae are normal. PERRL. EOMI. No discharge. Head: Atraumatic. ENT: No frontal and maxillary sinus tenderness.      Ears: Tympanic membranes pearly gray with good landmarks. No discharge.      Nose: Mild congestion/rhinnorhea.      Mouth/Throat: Mucous membranes are moist. Oropharynxerythematous. Tonsils enlarged 2+ bilaterally. No exudates. Uvula midline. Neck: No stridor.   Hematological/Lymphatic/Immunilogical: No cervical lymphadenopathy. Cardiovascular: Normal rate, regular rhythm.  Good  peripheral circulation. Respiratory: Normal respiratory effort without tachypnea or retractions. Lungs CTAB. Good air entry to the bases with no decreased or absent breath sounds. Gastrointestinal: Bowel sounds 4 quadrants. Soft and nontender to palpation. No guarding or rigidity. No palpable masses. No distention. Musculoskeletal: Full range of motion to all extremities. No gross deformities appreciated. Neurologic:  Normal speech and language. No gross focal neurologic deficits are appreciated.  Skin:  Skin is warm, dry and intact. No rash noted. Psychiatric: Mood and affect are normal. Speech and behavior are normal. Patient exhibits appropriate insight and judgement.   ____________________________________________   LABS (all labs ordered are listed, but only abnormal results are displayed)  Labs Reviewed  GROUP A STREP BY PCR - Abnormal; Notable for the following components:      Result Value   Group A Strep by PCR DETECTED (*)    All other components within normal limits  ____________________________________________  EKG   ____________________________________________  RADIOLOGY   No results found.  ____________________________________________    PROCEDURES  Procedure(s) performed:    Procedures    Medications  dexamethasone (DECADRON) injection 10 mg (10 mg Intramuscular Given 06/05/18 1713)  lidocaine (XYLOCAINE) 2 % viscous mouth solution 15 mL (15 mLs Mouth/Throat Given 06/05/18 1712)  penicillin v potassium (VEETID) tablet 500 mg (500 mg Oral Given 06/05/18 1712)     ____________________________________________   INITIAL IMPRESSION / ASSESSMENT AND PLAN / ED COURSE  Pertinent labs & imaging results that were available during my care of the patient were reviewed by me and considered in my medical decision making (see chart for details).  Review of the [place] CSRS was performed in accordance of the [facility] prior to dispensing any controlled  drugs.   Patient's diagnosis is consistent with strep throat. Vital signs and exam are reassuring. Patient appears well and is staying well hydrated. Patient should alternate tylenol and ibuprofen for fever. Patient feels comfortable going home. Patient will be discharged home with prescriptions for penicillin. Patient is to follow up with PCP as needed or otherwise directed. Patient is given ED precautions to return to the ED for any worsening or new symptoms.     ____________________________________________  FINAL CLINICAL IMPRESSION(S) / ED DIAGNOSES  Final diagnoses:  Strep throat      NEW MEDICATIONS STARTED DURING THIS VISIT:  ED Discharge Orders         Ordered    penicillin v potassium (VEETID) 500 MG tablet  3 times daily     06/05/18 1704    lidocaine (XYLOCAINE) 2 % solution  As needed     06/05/18 1704              This chart was dictated using voice recognition software/Dragon. Despite best efforts to proofread, errors can occur which can change the meaning. Any change was purely unintentional.    Enid Derry, PA-C 06/05/18 1819    Sharyn Creamer, MD 06/06/18 2047

## 2018-06-05 NOTE — ED Notes (Signed)
See triage note  Presents with sore throat,body aches and fever  States fever at home was 103 but afebrile on arrival   Sxs; started on Saturday

## 2019-03-20 ENCOUNTER — Emergency Department
Admission: EM | Admit: 2019-03-20 | Discharge: 2019-03-20 | Disposition: A | Discharge status: Home / Self Care | Payer: Medicaid Other | Attending: Emergency Medicine | Admitting: Emergency Medicine

## 2019-03-20 ENCOUNTER — Emergency Department: Payer: Medicaid Other

## 2019-03-20 ENCOUNTER — Encounter: Payer: Self-pay | Admitting: Emergency Medicine

## 2019-03-20 ENCOUNTER — Other Ambulatory Visit: Payer: Self-pay

## 2019-03-20 DIAGNOSIS — J449 Chronic obstructive pulmonary disease, unspecified: Secondary | ICD-10-CM | POA: Insufficient documentation

## 2019-03-20 DIAGNOSIS — Z20828 Contact with and (suspected) exposure to other viral communicable diseases: Secondary | ICD-10-CM | POA: Insufficient documentation

## 2019-03-20 DIAGNOSIS — Z79899 Other long term (current) drug therapy: Secondary | ICD-10-CM | POA: Insufficient documentation

## 2019-03-20 DIAGNOSIS — M5441 Lumbago with sciatica, right side: Secondary | ICD-10-CM | POA: Insufficient documentation

## 2019-03-20 DIAGNOSIS — J069 Acute upper respiratory infection, unspecified: Secondary | ICD-10-CM

## 2019-03-20 DIAGNOSIS — J45909 Unspecified asthma, uncomplicated: Secondary | ICD-10-CM | POA: Insufficient documentation

## 2019-03-20 DIAGNOSIS — F17210 Nicotine dependence, cigarettes, uncomplicated: Secondary | ICD-10-CM | POA: Diagnosis not present

## 2019-03-20 DIAGNOSIS — R05 Cough: Secondary | ICD-10-CM | POA: Diagnosis present

## 2019-03-20 LAB — POCT PREGNANCY, URINE: Preg Test, Ur: NEGATIVE

## 2019-03-20 MED ORDER — PREDNISONE 50 MG PO TABS: 50.0000 mg | ORAL_TABLET | Freq: Every day | ORAL | 0 refills | Status: DC

## 2019-03-20 MED ORDER — FLUTICASONE PROPIONATE 50 MCG/ACT NA SUSP: 1.0000 | Freq: Two times a day (BID) | NASAL | 0 refills | Status: DC

## 2019-03-20 MED ORDER — MELOXICAM 15 MG PO TABS: 15.0000 mg | ORAL_TABLET | Freq: Every day | ORAL | 0 refills | Status: DC

## 2019-03-20 MED ORDER — METHOCARBAMOL 500 MG PO TABS: 500.0000 mg | ORAL_TABLET | Freq: Four times a day (QID) | ORAL | 0 refills | Status: DC

## 2019-03-20 MED ORDER — HYDROCODONE-ACETAMINOPHEN 5-325 MG PO TABS
1.0000 | ORAL_TABLET | Freq: Once | ORAL | Status: AC
  Administered 2019-03-20: 22:00:00 1 via ORAL
  Filled 2019-03-20: qty 1

## 2019-03-20 MED ORDER — PSEUDOEPH-BROMPHEN-DM 30-2-10 MG/5ML PO SYRP: 10.0000 mL | ORAL_SOLUTION | Freq: Four times a day (QID) | ORAL | 0 refills | Status: DC | PRN

## 2019-03-20 NOTE — ED Notes (Signed)
Pt ambulatory to toilet to collect urine sample.  

## 2019-03-20 NOTE — ED Notes (Signed)
Pt returned from xray

## 2019-03-20 NOTE — ED Triage Notes (Signed)
Patient ambulatory to triage with steady gait, without difficulty or distress noted, mask in place; pt reports lower back pain radiating into hips and rt leg "for awhile"; also reports prod cough green sputum since this weekend

## 2019-03-20 NOTE — ED Provider Notes (Signed)
[facility] Emergency Department Provider Note  ____________________________________________  Time seen: Approximately 8:17 PM  I have reviewed the triage vital signs and the nursing notes.   HISTORY  Chief Complaint Cough    HPI Carrie Marshall is a 43 y.o. female who presents the emergency department complaining of 2 complaints.  Patient's primary complaint is nasal congestion, cough, fatigue.  Patient states that she has had 5 days of URI type symptoms.  Patient states that she thinks she has "a cold" but she is concerned given the COVID-19 pandemic that she may have Covid.  Patient is taken multiple over the counter medications without relief of her symptoms.  No chest pain or shortness of breath.  Patient denies any headache, neck pain or stiffness.  Possible low-grade fever at home.  Patient is also complaining of ongoing low back pain.  She states that she has had this for months to years.  Patient states that she has had radicular symptoms on the right lower extremity for the duration of her back pain.  No bowel or bladder dysfunction, saddle anesthesia or paresthesias.  No direct trauma to the back precipitating her ongoing back pain.         Past Medical History:  Diagnosis Date  . Anxiety   . Asthma   . Bipolar disorder (HCC)   . COPD (chronic obstructive pulmonary disease) (HCC)   . Depression   . Dyspnea   . GERD (gastroesophageal reflux disease)   . Headache   . Seizures (HCC) 2008   R/T to drugs    Patient Active Problem List   Diagnosis Date Noted  . Pancolitis (HCC) 03/28/2018  . Post-operative state 12/23/2016    Past Surgical History:  Procedure Laterality Date  . APPENDECTOMY    . LAPAROSCOPIC ASSISTED VAGINAL HYSTERECTOMY N/A 12/23/2016   Procedure: LAPAROSCOPIC ASSISTED VAGINAL HYSTERECTOMY;  Surgeon: Suzy Bouchard, MD;  Location: [facility] ORS;  Service: Gynecology;  Laterality: N/A;  . LAPAROSCOPIC LYSIS OF ADHESIONS   03/24/2016   Procedure: LAPAROSCOPIC LYSIS OF ADHESIONS;  Surgeon: Suzy Bouchard, MD;  Location: [facility] ORS;  Service: Gynecology;;  . LAPAROSCOPIC UNILATERAL SALPINGECTOMY Left 12/23/2016   Procedure: LAPAROSCOPIC UNILATERAL SALPINGECTOMY;  Surgeon: Schermerhorn, Ihor Austin, MD;  Location: [facility] ORS;  Service: Gynecology;  Laterality: Left;  . LAPAROSCOPIC UNILATERAL SALPINGO OOPHERECTOMY Right 12/23/2016   Procedure: LAPAROSCOPIC UNILATERAL SALPINGO OOPHORECTOMY;  Surgeon: Schermerhorn, Ihor Austin, MD;  Location: [facility] ORS;  Service: Gynecology;  Laterality: Right;  . LAPAROSCOPY N/A 03/24/2016   Procedure: LAPAROSCOPY DIAGNOSTIC;  Surgeon: Suzy Bouchard, MD;  Location: [facility] ORS;  Service: Gynecology;  Laterality: N/A;  . LAPAROSCOPY N/A 03/24/2016   Procedure: LAPAROSCOPY OPERATIVE;  Surgeon: Suzy Bouchard, MD;  Location: [facility] ORS;  Service: Gynecology;  Laterality: N/A;  . TUBAL LIGATION      Prior to Admission medications   Medication Sig Start Date End Date Taking? Authorizing Provider  acetaminophen (TYLENOL) 500 MG tablet Take 1,000 mg by mouth every 6 (six) hours as needed for moderate pain or headache.    [provider]  amitriptyline (ELAVIL) 10 MG tablet TAKE 1 TABLET BY MOUTH NIGHTLY AT BEDTIME FOR MOOD AND HEADACHES 01/11/18   [provider]  brompheniramine-pseudoephedrine-DM 30-2-10 MG/5ML syrup Take 10 mLs by mouth 4 (four) times daily as needed. 03/20/19   Lauri Purdum, Delorise Royals, PA-C  fluticasone (FLONASE) 50 MCG/ACT nasal spray Place 1 spray into both nostrils 2 (two) times daily. 03/20/19   Charnele Semple, Delorise Royals, PA-C  HYDROcodone-acetaminophen (NORCO/VICODIN) 5-325 MG tablet Take 1 tablet by mouth every 6 (six) hours as needed for moderate pain or severe pain. 03/28/18   Vaughan Basta, MD  lidocaine (XYLOCAINE) 2 % solution Use as directed 10 mLs in the mouth or throat as needed for mouth pain. 06/05/18   Laban Emperor, PA-C  meloxicam  (MOBIC) 15 MG tablet Take 1 tablet (15 mg total) by mouth daily. 03/20/19   Presleigh Feldstein, Charline Bills, PA-C  methocarbamol (ROBAXIN) 500 MG tablet Take 1 tablet (500 mg total) by mouth 4 (four) times daily. 03/20/19   Yavuz Kirby, Charline Bills, PA-C  montelukast (SINGULAIR) 10 MG tablet Take 10 mg by mouth at bedtime.    [provider]  omeprazole (PRILOSEC) 40 MG capsule Take 40 mg by mouth daily.    [provider]  ondansetron (ZOFRAN) 4 MG tablet Take 1 tablet (4 mg total) by mouth every 6 (six) hours as needed for nausea. 03/28/18   Vaughan Basta, MD  penicillin v potassium (VEETID) 500 MG tablet Take 1 tablet (500 mg total) by mouth 3 (three) times daily. 06/05/18   Laban Emperor, PA-C  predniSONE (DELTASONE) 50 MG tablet Take 1 tablet (50 mg total) by mouth daily with breakfast. 03/20/19   Alban Marucci, Charline Bills, PA-C  PROAIR HFA 108 (90 Base) MCG/ACT inhaler INHALE 2 PUFFS INTO THE LUNGS EVERY 4-6 HOURS AS NEEDED FOR WHEEZING. 11/01/16   [provider]    Allergies Gabapentin and Chantix [varenicline]  Family History  Adopted: Yes    Social History Social History   Tobacco Use  . Smoking status: Current Every Day Smoker    Packs/day: 1.00    Types: Cigarettes  . Smokeless tobacco: Never Used  Substance Use Topics  . Alcohol use: No  . Drug use: Yes    Types: Marijuana    Comment: In the past--none since 2012 -crack   mj- 3 weeks ago     Review of Systems  Constitutional: Subjective low-grade fever/chills Eyes: No visual changes. No discharge ENT: Positive for nasal congestion Cardiovascular: no chest pain. Respiratory: Positive cough. No SOB. Gastrointestinal: No abdominal pain.  No nausea, no vomiting.  No diarrhea.  No constipation. Genitourinary: Negative for dysuria. No hematuria Musculoskeletal: Positive for low back pain with right-sided radicular symptoms Skin: Negative for rash, abrasions, lacerations, ecchymosis. Neurological:  Negative for headaches, focal weakness or numbness. 10-point ROS otherwise negative.  ____________________________________________   PHYSICAL EXAM:  VITAL SIGNS: ED Triage Vitals [03/20/19 1956]  Enc Vitals Group     BP (!) 135/98     Pulse Rate 93     Resp 20     Temp 98.8 F (37.1 C)     Temp Source Oral     SpO2 98 %     Weight 170 lb (77.1 kg)     Height 5\' 7"  (1.702 m)     Head Circumference      Peak Flow      Pain Score 8     Pain Loc      Pain Edu?      Excl. in [place]?      Constitutional: Alert and oriented. Well appearing and in no acute distress. Eyes: Conjunctivae are normal. PERRL. EOMI. Head: Atraumatic. ENT:      Ears: EACs are unremarkable bilaterally.  TMs are minimally bulging bilaterally.      Nose: Moderate clear congestion/rhinnorhea.      Mouth/Throat: Mucous membranes are moist.  Oropharynx is nonerythematous and nonedematous.  Uvula is midline. Neck: No stridor.  Neck is supple full range of motion Hematological/Lymphatic/Immunilogical: No cervical lymphadenopathy. Cardiovascular: Normal rate, regular rhythm. Normal S1 and S2.  Good peripheral circulation. Respiratory: Normal respiratory effort without tachypnea or retractions. Lungs with a few scattered expiratory wheezes left lower lung field.  No inspiratory wheezing.  No rales or rhonchi.Peri [place] air entry to the bases with no decreased or absent breath sounds. Gastrointestinal: Bowel sounds 4 quadrants. Soft and nontender to palpation. No guarding or rigidity. No palpable masses. No distention. No CVA tenderness. Musculoskeletal: Full range of motion to all extremities. No gross deformities appreciated.  Visualization of the lumbar spine reveals no visible abnormality.  Patient has tenderness to palpation midline and right paraspinal muscle region in the L3-L5 region.  No palpable abnormality or step-off.  Patient is tender to palpation right-sided sciatic notch.  Negative straight leg raise  bilaterally.  Dorsalis pedis pulse intact bilateral lower extremities. Neurologic:  Normal speech and language. No gross focal neurologic deficits are appreciated.  Skin:  Skin is warm, dry and intact. No rash noted. Psychiatric: Mood and affect are normal. Speech and behavior are normal. Patient exhibits appropriate insight and judgement.   ____________________________________________   LABS (all labs ordered are listed, but only abnormal results are displayed)  Labs Reviewed  NOVEL CORONAVIRUS, NAA (HOSP ORDER, SEND-OUT TO REF LAB; TAT 18-24 HRS)  POC URINE PREG, ED  POCT PREGNANCY, URINE   ____________________________________________  EKG   ____________________________________________  RADIOLOGY I personally viewed and evaluated these images as part of my medical decision making, as well as reviewing the written report by the radiologist.  Dg Chest 1 View  Result Date: 03/20/2019 CLINICAL DATA:  Patient ambulatory to triage with steady gait, without difficulty or distress noted, mask in place; pt reports lower back pain radiating into hips and rt leg "for awhile"; also reports prod cough green sputum since this weekend EXAM: CHEST  1 VIEW COMPARISON:  Chest radiograph of in 04/2018 FINDINGS: The heart size and mediastinal contours are within normal limits. The lungs are clear. No pneumothorax or large pleural effusion. Possible lucent lesion in the left humeral head. IMPRESSION: 1.  No evidence of active disease in the chest. 2. Possible lucent lesion in the head of the left humerus. Recommend dedicated left shoulder radiographs. Electronically Signed   By: Emmaline Kluver M.D.   On: 03/20/2019 21:15   Dg Lumbar Spine 2-3 Views  Result Date: 03/20/2019 CLINICAL DATA:  Patient ambulatory to triage with steady gait, without difficulty or distress noted, mask in place; pt reports lower back pain radiating into hips and rt leg "for awhile"; also reports prod cough green sputum since  this weekend EXAM: LUMBAR SPINE - 2-3 VIEW COMPARISON:  CT abdomen pelvis 03/27/2018 FINDINGS: Mild levocurvature. Alignment intact. Vertebral body heights and intervertebral disc spaces maintained. No significant arthropathy. Sclerotic focus in the medial right ilium, similar to prior CT, consistent with a bone island. SI joints are open. Nonobstructive bowel gas pattern. IMPRESSION: No acute osseous abnormality in the lumbar spine. Electronically Signed   By: Emmaline Kluver M.D.   On: 03/20/2019 21:13   Dg Shoulder Left  Result Date: 03/20/2019 CLINICAL DATA:  Abnormal chest x-ray EXAM: LEFT SHOULDER - 2+ VIEW COMPARISON:  Chest x-ray 03/20/2019 FINDINGS: There is no evidence of fracture or dislocation. There is no evidence of arthropathy or other focal bone abnormality. Soft tissues are unremarkable. IMPRESSION: Negative. Electronically Signed   By: Adrian Prows.D.  On: 03/20/2019 22:00    ____________________________________________    PROCEDURES  Procedure(s) performed:    Procedures    Medications  HYDROcodone-acetaminophen (NORCO/VICODIN) 5-325 MG per tablet 1 tablet (1 tablet Oral Given 03/20/19 2145)     ____________________________________________   INITIAL IMPRESSION / ASSESSMENT AND PLAN / ED COURSE  Pertinent labs & imaging results that were available during my care of the patient were reviewed by me and considered in my medical decision making (see chart for details).  Review of the [place] CSRS was performed in accordance of the [facility] prior to dispensing any controlled drugs.           Patient's diagnosis is consistent with viral URI with cough, low back pain with sciatica.  Patient presented to the emergency department 2 separate complaints.  Symptoms were most consistent with viral URI but with mild expiratory wheeze in the left lower lung field imaging was obtained of patient's chest.  This returns with no acute cardiopulmonary abnormality but there was  concern for possible lucency of the proximal left humerus.  Dedicated imaging reveals no lucency or mass of the left proximal humerus.  Evaluation of the lumbar spine reveals no concerning neurological deficits and imaging is reassuring.  Patient will be treated with symptomatic medication.  Follow-up primary care as needed. Patient is given ED precautions to return to the ED for any worsening or new symptoms.     ____________________________________________  FINAL CLINICAL IMPRESSION(S) / ED DIAGNOSES  Final diagnoses:  Viral URI with cough  Acute midline low back pain with right-sided sciatica      NEW MEDICATIONS STARTED DURING THIS VISIT:  ED Discharge Orders         Ordered    brompheniramine-pseudoephedrine-DM 30-2-10 MG/5ML syrup  4 times daily PRN     03/20/19 2256    fluticasone (FLONASE) 50 MCG/ACT nasal spray  2 times daily     03/20/19 2256    meloxicam (MOBIC) 15 MG tablet  Daily     03/20/19 2256    methocarbamol (ROBAXIN) 500 MG tablet  4 times daily     03/20/19 2256    predniSONE (DELTASONE) 50 MG tablet  Daily with breakfast     03/20/19 2256              This chart was dictated using voice recognition software/Dragon. Despite best efforts to proofread, errors can occur which can change the meaning. Any change was purely unintentional.    Racheal PatchesCuthriell, Devun Anna D, PA-C 03/21/19 0006    Chesley NoonJessup, Charles, MD 03/22/19 [phone]

## 2019-03-22 LAB — NOVEL CORONAVIRUS, NAA (HOSP ORDER, SEND-OUT TO REF LAB; TAT 18-24 HRS): SARS-CoV-2, NAA: NOT DETECTED

## 2019-09-27 ENCOUNTER — Emergency Department: Payer: Medicaid Other

## 2019-09-27 ENCOUNTER — Emergency Department
Admission: EM | Admit: 2019-09-27 | Discharge: 2019-09-27 | Disposition: A | Discharge status: Home / Self Care | Payer: Medicaid Other | Attending: Emergency Medicine | Admitting: Emergency Medicine

## 2019-09-27 ENCOUNTER — Other Ambulatory Visit: Payer: Self-pay

## 2019-09-27 DIAGNOSIS — J449 Chronic obstructive pulmonary disease, unspecified: Secondary | ICD-10-CM | POA: Insufficient documentation

## 2019-09-27 DIAGNOSIS — F17210 Nicotine dependence, cigarettes, uncomplicated: Secondary | ICD-10-CM | POA: Diagnosis not present

## 2019-09-27 DIAGNOSIS — Z79899 Other long term (current) drug therapy: Secondary | ICD-10-CM | POA: Diagnosis not present

## 2019-09-27 DIAGNOSIS — Z20822 Contact with and (suspected) exposure to covid-19: Secondary | ICD-10-CM | POA: Insufficient documentation

## 2019-09-27 DIAGNOSIS — J020 Streptococcal pharyngitis: Secondary | ICD-10-CM

## 2019-09-27 DIAGNOSIS — J029 Acute pharyngitis, unspecified: Secondary | ICD-10-CM | POA: Diagnosis present

## 2019-09-27 DIAGNOSIS — R079 Chest pain, unspecified: Secondary | ICD-10-CM

## 2019-09-27 LAB — GROUP A STREP BY PCR: Group A Strep by PCR: DETECTED — AB

## 2019-09-27 MED ORDER — ACETAMINOPHEN-CODEINE 120-12 MG/5ML PO SOLN
5.0000 mL | Freq: Once | ORAL | Status: AC
  Administered 2019-09-27: 5 mL via ORAL
  Filled 2019-09-27: qty 5

## 2019-09-27 MED ORDER — LIDOCAINE VISCOUS HCL 2 % MT SOLN: 10.0000 mL | OROMUCOSAL | 0 refills | Status: DC | PRN

## 2019-09-27 MED ORDER — AMOXICILLIN 500 MG PO CAPS: 500.0000 mg | ORAL_CAPSULE | Freq: Three times a day (TID) | ORAL | 0 refills | Status: DC

## 2019-09-27 MED ORDER — AMOXICILLIN 500 MG PO CAPS
500.0000 mg | ORAL_CAPSULE | Freq: Once | ORAL | Status: AC
  Administered 2019-09-27: 500 mg via ORAL
  Filled 2019-09-27: qty 1

## 2019-09-27 NOTE — ED Provider Notes (Signed)
[facility] Emergency Department Provider Note  ____________________________________________  Time seen: Approximately 6:12 PM  I have reviewed the triage vital signs and the nursing notes.   HISTORY  Chief Complaint Cough    HPI Carrie Marshall is a 44 y.o. female that presents to the emergency department for evaluation of rhinorrhea, sore throat, hoarse voice, nonproductive cough, chest discomfort for 3 days.  Patient states that she is coughing so much that her voice is hoarse.  No sick contacts.  She is not vaccinated for COVID-19.  No fever, shortness of breath, vomiting, abdominal pain, diarrhea.   Past Medical History:  Diagnosis Date  . Anxiety   . Asthma   . Bipolar disorder (HCC)   . COPD (chronic obstructive pulmonary disease) (HCC)   . Depression   . Dyspnea   . GERD (gastroesophageal reflux disease)   . Headache   . Seizures (HCC) 2008   R/T to drugs    Patient Active Problem List   Diagnosis Date Noted  . Pancolitis (HCC) 03/28/2018  . Post-operative state 12/23/2016    Past Surgical History:  Procedure Laterality Date  . APPENDECTOMY    . LAPAROSCOPIC ASSISTED VAGINAL HYSTERECTOMY N/A 12/23/2016   Procedure: LAPAROSCOPIC ASSISTED VAGINAL HYSTERECTOMY;  Surgeon: Suzy Bouchard, MD;  Location: [facility] ORS;  Service: Gynecology;  Laterality: N/A;  . LAPAROSCOPIC LYSIS OF ADHESIONS  03/24/2016   Procedure: LAPAROSCOPIC LYSIS OF ADHESIONS;  Surgeon: Suzy Bouchard, MD;  Location: [facility] ORS;  Service: Gynecology;;  . LAPAROSCOPIC UNILATERAL SALPINGECTOMY Left 12/23/2016   Procedure: LAPAROSCOPIC UNILATERAL SALPINGECTOMY;  Surgeon: Schermerhorn, Ihor Austin, MD;  Location: [facility] ORS;  Service: Gynecology;  Laterality: Left;  . LAPAROSCOPIC UNILATERAL SALPINGO OOPHERECTOMY Right 12/23/2016   Procedure: LAPAROSCOPIC UNILATERAL SALPINGO OOPHORECTOMY;  Surgeon: Schermerhorn, Ihor Austin, MD;  Location: [facility] ORS;  Service: Gynecology;   Laterality: Right;  . LAPAROSCOPY N/A 03/24/2016   Procedure: LAPAROSCOPY DIAGNOSTIC;  Surgeon: Suzy Bouchard, MD;  Location: [facility] ORS;  Service: Gynecology;  Laterality: N/A;  . LAPAROSCOPY N/A 03/24/2016   Procedure: LAPAROSCOPY OPERATIVE;  Surgeon: Suzy Bouchard, MD;  Location: [facility] ORS;  Service: Gynecology;  Laterality: N/A;  . TUBAL LIGATION      Prior to Admission medications   Medication Sig Start Date End Date Taking? Authorizing Provider  acetaminophen (TYLENOL) 500 MG tablet Take 1,000 mg by mouth every 6 (six) hours as needed for moderate pain or headache.    [provider]  amitriptyline (ELAVIL) 10 MG tablet TAKE 1 TABLET BY MOUTH NIGHTLY AT BEDTIME FOR MOOD AND HEADACHES 01/11/18   [provider]  amoxicillin (AMOXIL) 500 MG capsule Take 1 capsule (500 mg total) by mouth 3 (three) times daily. 09/27/19   Enid Derry, PA-C  fluticasone (FLONASE) 50 MCG/ACT nasal spray Place 1 spray into both nostrils 2 (two) times daily. 03/20/19   Cuthriell, Delorise Royals, PA-C  lidocaine (XYLOCAINE) 2 % solution Use as directed 10 mLs in the mouth or throat as needed. 09/27/19   Enid Derry, PA-C  methocarbamol (ROBAXIN) 500 MG tablet Take 1 tablet (500 mg total) by mouth 4 (four) times daily. 03/20/19   Cuthriell, Delorise Royals, PA-C  montelukast (SINGULAIR) 10 MG tablet Take 10 mg by mouth at bedtime.    [provider]  omeprazole (PRILOSEC) 40 MG capsule Take 40 mg by mouth daily.    [provider]  ondansetron (ZOFRAN) 4 MG tablet Take 1 tablet (4 mg total) by mouth every 6 (six) hours as  needed for nausea. 03/28/18   Vaughan Basta, MD  PROAIR HFA 108 [phone] Base) MCG/ACT inhaler INHALE 2 PUFFS INTO THE LUNGS EVERY 4-6 HOURS AS NEEDED FOR WHEEZING. 11/01/16   [provider]    Allergies Gabapentin and Chantix [varenicline]  Family History  Adopted: Yes    Social History Social History   Tobacco Use  . Smoking status:  Current Every Day Smoker    Packs/day: 1.00    Types: Cigarettes  . Smokeless tobacco: Never Used  Substance Use Topics  . Alcohol use: No  . Drug use: Yes    Types: Marijuana    Comment: In the past--none since 2012 -crack   mj- 3 weeks ago     Review of Systems  Constitutional: No fever/chills Eyes: No visual changes. No discharge. ENT: Positive for congestion and rhinorrhea. Positive for sore throat. Cardiovascular: Positive for chest discomfort. Respiratory: Positive for cough. No SOB. Gastrointestinal: No abdominal pain.  No nausea, no vomiting.  No diarrhea.  No constipation. Musculoskeletal: Negative for musculoskeletal pain. Skin: Negative for rash, abrasions, lacerations, ecchymosis. Neurological: Negative for headaches.   ____________________________________________   PHYSICAL EXAM:  VITAL SIGNS: ED Triage Vitals  Enc Vitals Group     BP 09/27/19 1651 114/75     Pulse Rate 09/27/19 1651 80     Resp 09/27/19 1651 18     Temp 09/27/19 1651 98.2 F (36.8 C)     Temp Source 09/27/19 1651 Oral     SpO2 09/27/19 1651 97 %     Weight 09/27/19 1652 180 lb (81.6 kg)     Height 09/27/19 1652 5\' 6"  (1.676 m)     Head Circumference --      Peak Flow --      Pain Score 09/27/19 1652 9     Pain Loc --      Pain Edu? --      Excl. in [place]? --      Constitutional: Alert and oriented. Well appearing and in no acute distress. Eyes: Conjunctivae are normal. PERRL. EOMI. No discharge. Head: Atraumatic. ENT: No frontal and maxillary sinus tenderness.      Ears: Tympanic membranes pearly gray with good landmarks. No discharge.      Nose: Mild congestion/rhinnorhea.      Mouth/Throat: Mucous membranes are moist. Oropharynx erythematous. Tonsils not enlarged. No exudates. Uvula midline. Neck: No stridor.   Hematological/Lymphatic/Immunilogical: No cervical lymphadenopathy. Cardiovascular: Normal rate, regular rhythm.  Good peripheral circulation. Respiratory: Normal  respiratory effort without tachypnea or retractions. Lungs CTAB. Good air entry to the bases with no decreased or absent breath sounds. Gastrointestinal: Bowel sounds 4 quadrants. Soft and nontender to palpation. No guarding or rigidity. No palpable masses. No distention. Musculoskeletal: Full range of motion to all extremities. No gross deformities appreciated. Neurologic:  Normal speech and language. No gross focal neurologic deficits are appreciated.  Skin:  Skin is warm, dry and intact. No rash noted. Psychiatric: Mood and affect are normal. Speech and behavior are normal. Patient exhibits appropriate insight and judgement.   ____________________________________________   LABS (all labs ordered are listed, but only abnormal results are displayed)  Labs Reviewed  GROUP A STREP BY PCR - Abnormal; Notable for the following components:      Result Value   Group A Strep by PCR DETECTED (*)    All other components within normal limits  SARS CORONAVIRUS 2 (TAT 6-24 HRS)   ____________________________________________  EKG   ____________________________________________  RADIOLOGY Robinette Haines,  personally viewed and evaluated these images (plain radiographs) as part of my medical decision making, as well as reviewing the written report by the radiologist.  DG Chest Port 1 View  Result Date: 09/27/2019 CLINICAL DATA:  Cough chest pain for 2-3 days EXAM: PORTABLE CHEST 1 VIEW COMPARISON:  03/20/2019 FINDINGS: Cardiomediastinal contours and hilar structures are normal. Lungs are clear. No sign of pleural effusion. Visualized skeletal structures are unremarkable. IMPRESSION: No acute cardiopulmonary disease. Electronically Signed   By: Donzetta Kohut M.D.   On: 09/27/2019 18:49    ____________________________________________    PROCEDURES  Procedure(s) performed:    Procedures    Medications  acetaminophen-codeine 120-12 MG/5ML solution 5 mL (5 mLs Oral Given 09/27/19 1807)   amoxicillin (AMOXIL) capsule 500 mg (500 mg Oral Given 09/27/19 1943)     ____________________________________________   INITIAL IMPRESSION / ASSESSMENT AND PLAN / ED COURSE  Pertinent labs & imaging results that were available during my care of the patient were reviewed by me and considered in my medical decision making (see chart for details).  Review of the [place] CSRS was performed in accordance of the [facility] prior to dispensing any controlled drugs.     Patient's diagnosis is consistent with strep pharyngitis. Vital signs and exam are reassuring. Patient appears well and is staying well hydrated.  Chest x-ray negative for acute cardiopulmonary processes.  Covid test is pending. Strep is positive.   Patient was given a dose of Tylenol with codeine in the emergency department for symptoms.  Patient should alternate tylenol and ibuprofen for fever. Patient feels comfortable going home. Patient will be discharged home with prescriptions for amoxicillin and viscous lidocaine. Patient is to follow up with ENT for recurrent strep as needed or otherwise directed. Patient is given ED precautions to return to the ED for any worsening or new symptoms.   Carrie Marshall was evaluated in Emergency Department on 09/27/2019 for the symptoms described in the history of present illness. She was evaluated in the context of the global COVID-19 pandemic, which necessitated consideration that the patient might be at risk for infection with the SARS-CoV-2 virus that causes COVID-19. Institutional protocols and algorithms that pertain to the evaluation of patients at risk for COVID-19 are in a state of rapid change based on information released by regulatory bodies including the CDC and federal and state organizations. These policies and algorithms were followed during the patient's care in the ED.  ____________________________________________  FINAL CLINICAL IMPRESSION(S) / ED DIAGNOSES  Final diagnoses:  Strep  throat      NEW MEDICATIONS STARTED DURING THIS VISIT:  ED Discharge Orders         Ordered    amoxicillin (AMOXIL) 500 MG capsule  3 times daily     09/27/19 1926    lidocaine (XYLOCAINE) 2 % solution  As needed     09/27/19 1926              This chart was dictated using voice recognition software/Dragon. Despite best efforts to proofread, errors can occur which can change the meaning. Any change was purely unintentional.    Enid Derry, PA-C 09/27/19 2050    Dionne Bucy, MD 09/28/19 0021

## 2019-09-27 NOTE — ED Triage Notes (Addendum)
Pt arrives via POV for c/o cough x 2-3 days. Pt reports it started out as a runny nose but has worsened in the last 3 days. Denies fever. Reports chest hurts during coughing episodes. NAD noted

## 2019-09-28 LAB — SARS CORONAVIRUS 2 (TAT 6-24 HRS): SARS Coronavirus 2: NEGATIVE

## 2020-06-23 ENCOUNTER — Emergency Department (HOSPITAL_BASED_OUTPATIENT_CLINIC_OR_DEPARTMENT_OTHER): Payer: Medicaid Other

## 2020-06-23 ENCOUNTER — Other Ambulatory Visit: Payer: Self-pay

## 2020-06-23 ENCOUNTER — Emergency Department (HOSPITAL_BASED_OUTPATIENT_CLINIC_OR_DEPARTMENT_OTHER)
Admission: EM | Admit: 2020-06-23 | Discharge: 2020-06-23 | Disposition: A | Discharge status: Home / Self Care | Payer: Medicaid Other | Attending: Emergency Medicine | Admitting: Emergency Medicine

## 2020-06-23 DIAGNOSIS — J45909 Unspecified asthma, uncomplicated: Secondary | ICD-10-CM | POA: Insufficient documentation

## 2020-06-23 DIAGNOSIS — R059 Cough, unspecified: Secondary | ICD-10-CM | POA: Diagnosis present

## 2020-06-23 DIAGNOSIS — Z79899 Other long term (current) drug therapy: Secondary | ICD-10-CM | POA: Diagnosis not present

## 2020-06-23 DIAGNOSIS — F17210 Nicotine dependence, cigarettes, uncomplicated: Secondary | ICD-10-CM | POA: Diagnosis not present

## 2020-06-23 DIAGNOSIS — J449 Chronic obstructive pulmonary disease, unspecified: Secondary | ICD-10-CM | POA: Insufficient documentation

## 2020-06-23 DIAGNOSIS — U071 COVID-19: Secondary | ICD-10-CM | POA: Insufficient documentation

## 2020-06-23 DIAGNOSIS — J069 Acute upper respiratory infection, unspecified: Secondary | ICD-10-CM

## 2020-06-23 LAB — GROUP A STREP BY PCR: Group A Strep by PCR: NOT DETECTED

## 2020-06-23 MED ORDER — ONDANSETRON 4 MG PO TBDP
ORAL_TABLET | ORAL | Status: AC
  Filled 2020-06-23: qty 1

## 2020-06-23 MED ORDER — ONDANSETRON 4 MG PO TBDP
4.0000 mg | ORAL_TABLET | Freq: Once | ORAL | Status: AC
  Administered 2020-06-23: 4 mg via ORAL

## 2020-06-23 MED ORDER — ACETAMINOPHEN 500 MG PO TABS
1000.0000 mg | ORAL_TABLET | Freq: Once | ORAL | Status: AC
  Administered 2020-06-23: 1000 mg via ORAL
  Filled 2020-06-23: qty 2

## 2020-06-23 NOTE — Discharge Instructions (Addendum)
It was our pleasure to provide your ER care today - we hope that you feel better.  Your xray looks good and strep test is negative.   Your covid test should be resulted in 5-6 hours - you may check MyChart or call for results - see information provided.   Take acetaminophen or ibuprofen as need for fever and/or body aches.   Return to ER if worse, increased trouble breathing, persistent vomiting, or other concern.

## 2020-06-23 NOTE — ED Provider Notes (Signed)
[facility] EMERGENCY DEPARTMENT Provider Note   CSN: [id number] Arrival date & time: 06/23/20  1542     History Chief Complaint  Patient presents with  . Covid Symptoms    Carrie Marshall is a 45 y.o. female.  Patient c/o non prod cough, body aches, sore throat, for past couple days. Symptoms acute onset, moderate, constant, persistent. No specific known covid exposure or other specific ill contacts. No chest pain or sob. No abd pain. Did have episode post tussive emesis, not bloody or bilious. No abd distension. Having normal bms. No severe headaches. No neck pain or stiffness. No fever.   The history is provided by the patient.       Past Medical History:  Diagnosis Date  . Anxiety   . Asthma   . Bipolar disorder ([place])   . COPD (chronic obstructive pulmonary disease) ([place])   . Depression   . Dyspnea   . GERD (gastroesophageal reflux disease)   . Headache   . Seizures ([place]) 2008   R/T to drugs    Patient Active Problem List   Diagnosis Date Noted  . 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 Post-operative state 12/23/2016    Past Surgical History:  Procedure Laterality Date  . APPENDECTOMY    . LAPAROSCOPIC ASSISTED VAGINAL HYSTERECTOMY N/A 12/23/2016   Procedure: LAPAROSCOPIC ASSISTED VAGINAL HYSTERECTOMY;  Surgeon: Boykin Nearing, MD;  Location: [facility] ORS;  Service: Gynecology;  Laterality: N/A;  . LAPAROSCOPIC LYSIS OF ADHESIONS  03/24/2016   Procedure: LAPAROSCOPIC LYSIS OF ADHESIONS;  Surgeon: Boykin Nearing, MD;  Location: [facility] ORS;  Service: Gynecology;;  . LAPAROSCOPIC UNILATERAL SALPINGECTOMY Left 12/23/2016   Procedure: LAPAROSCOPIC UNILATERAL SALPINGECTOMY;  Surgeon: Schermerhorn, Gwen Her, MD;  Location: [facility] ORS;  Service: Gynecology;  Laterality: Left;  . LAPAROSCOPIC UNILATERAL SALPINGO OOPHERECTOMY Right 12/23/2016   Procedure: LAPAROSCOPIC UNILATERAL SALPINGO OOPHORECTOMY;  Surgeon: Schermerhorn, Gwen Her, MD;  Location: [facility] ORS;   Service: Gynecology;  Laterality: Right;  . LAPAROSCOPY N/A 03/24/2016   Procedure: LAPAROSCOPY DIAGNOSTIC;  Surgeon: Boykin Nearing, MD;  Location: [facility] ORS;  Service: Gynecology;  Laterality: N/A;  . LAPAROSCOPY N/A 03/24/2016   Procedure: LAPAROSCOPY OPERATIVE;  Surgeon: Boykin Nearing, MD;  Location: [facility] ORS;  Service: Gynecology;  Laterality: N/A;  . TUBAL LIGATION       OB History   No obstetric history on file.     Family History  Adopted: Yes    Social History   Tobacco Use  . Smoking status: Current Every Day Smoker    Packs/day: 1.00    Types: Cigarettes  . Smokeless tobacco: Never Used  Vaping Use  . Vaping Use: Former  Substance Use Topics  . Alcohol use: No  . Drug use: Yes    Types: Marijuana    Comment: In the past--none since 2012 -crack   mj- 3 weeks ago    Home Medications Prior to Admission medications   Medication Sig Start Date End Date Taking? Authorizing Provider  acetaminophen (TYLENOL) 500 MG tablet Take 1,000 mg by mouth every 6 (six) hours as needed for moderate pain or headache.    [provider]  amitriptyline (ELAVIL) 10 MG tablet TAKE 1 TABLET BY MOUTH NIGHTLY AT BEDTIME FOR MOOD AND HEADACHES 01/11/18   [provider]  amoxicillin (AMOXIL) 500 MG capsule Take 1 capsule (500 mg total) by mouth 3 (three) times daily. 09/27/19   Laban Emperor, PA-C  fluticasone (FLONASE) 50 MCG/ACT nasal spray Place 1 spray into  both nostrils 2 (two) times daily. 03/20/19   Cuthriell, Charline Bills, PA-C  lidocaine (XYLOCAINE) 2 % solution Use as directed 10 mLs in the mouth or throat as needed. 09/27/19   Laban Emperor, PA-C  methocarbamol (ROBAXIN) 500 MG tablet Take 1 tablet (500 mg total) by mouth 4 (four) times daily. 03/20/19   Cuthriell, Charline Bills, PA-C  montelukast (SINGULAIR) 10 MG tablet Take 10 mg by mouth at bedtime.    [provider]  omeprazole (PRILOSEC) 40 MG capsule Take 40 mg by mouth daily.    [provider]  ondansetron (ZOFRAN) 4 MG tablet Take 1 tablet (4 mg total) by mouth every 6 (six) hours as needed for nausea. 03/28/18   Vaughan Basta, MD  PROAIR HFA 108 [phone] Base) MCG/ACT inhaler INHALE 2 PUFFS INTO THE LUNGS EVERY 4-6 HOURS AS NEEDED FOR WHEEZING. 11/01/16   [provider]    Allergies    Gabapentin and Chantix [varenicline]  Review of Systems   Review of Systems  Constitutional: Negative for fever.  HENT: Positive for sore throat. Negative for trouble swallowing.   Eyes: Negative for redness.  Respiratory: Positive for cough. Negative for shortness of breath.   Cardiovascular: Negative for chest pain.  Gastrointestinal: Negative for abdominal pain and diarrhea.  Genitourinary: Negative for dysuria and flank pain.  Musculoskeletal: Positive for myalgias. Negative for neck pain and neck stiffness.  Skin: Negative for rash.  Neurological: Negative for headaches.  Hematological: Does not bruise/bleed easily.  Psychiatric/Behavioral: Negative for confusion.    Physical Exam Updated Vital Signs BP 111/66 (BP Location: Left Arm)   Pulse 63   Temp 98.1 F (36.7 C) (Oral)   Resp 18   Ht 1.689 m (5' 6.5")   Wt 77.1 kg   LMP 12/18/2016 (Approximate)   SpO2 99%   BMI 27.03 kg/m   Physical Exam Vitals and nursing note reviewed.  Constitutional:      Appearance: Normal appearance. She is well-developed.  HENT:     Head: Atraumatic.     Nose: Nose normal.     Mouth/Throat:     Mouth: Mucous membranes are moist.  Eyes:     General: No scleral icterus.    Conjunctiva/sclera: Conjunctivae normal.  Neck:     Trachea: No tracheal deviation.  Cardiovascular:     Rate and Rhythm: Normal rate and regular rhythm.     Pulses: Normal pulses.     Heart sounds: Normal heart sounds. No murmur heard. No friction rub. No gallop.   Pulmonary:     Effort: Pulmonary effort is normal. No respiratory distress.     Breath sounds: Normal breath sounds.   Abdominal:     General: Bowel sounds are normal. There is no distension.     Palpations: Abdomen is soft.     Tenderness: There is no abdominal tenderness. There is no guarding.  Genitourinary:    Comments: No cva tenderness.  Musculoskeletal:        General: No swelling.     Cervical back: Normal range of motion and neck supple. No rigidity. No muscular tenderness.  Skin:    General: Skin is warm and dry.     Findings: No rash.  Neurological:     Mental Status: She is alert.     Comments: Alert, speech normal.   Psychiatric:        Mood and Affect: Mood normal.     ED Results / Procedures / Treatments   Labs (all  labs ordered are listed, but only abnormal results are displayed) Results for orders placed or performed during the hospital encounter of 06/23/20  Group A Strep by PCR   Specimen: Throat; Sterile Swab  Result Value Ref Range   Group A Strep by PCR NOT DETECTED NOT DETECTED   DG Chest Portable 1 View  Result Date: 06/23/2020 CLINICAL DATA:  Shortness of breath, nausea vomiting congestion, cough and sore throat EXAM: PORTABLE CHEST 1 VIEW COMPARISON:  09/27/2019 FINDINGS: No consolidation, features of edema, pneumothorax, or effusion. Pulmonary vascularity is normally distributed. The cardiomediastinal contours are unremarkable. No acute osseous or soft tissue abnormality. IMPRESSION: No acute cardiopulmonary abnormality. Electronically Signed   By: Lovena Le M.D.   On: 06/23/2020 16:16    EKG None  Radiology DG Chest Portable 1 View  Result Date: 06/23/2020 CLINICAL DATA:  Shortness of breath, nausea vomiting congestion, cough and sore throat EXAM: PORTABLE CHEST 1 VIEW COMPARISON:  09/27/2019 FINDINGS: No consolidation, features of edema, pneumothorax, or effusion. Pulmonary vascularity is normally distributed. The cardiomediastinal contours are unremarkable. No acute osseous or soft tissue abnormality. IMPRESSION: No acute cardiopulmonary abnormality.  Electronically Signed   By: Lovena Le M.D.   On: 06/23/2020 16:16    Procedures Procedures   Medications Ordered in ED Medications  ondansetron (ZOFRAN-ODT) disintegrating tablet 4 mg (4 mg Oral Given 06/23/20 1601)  ondansetron (ZOFRAN-ODT) 4 MG disintegrating tablet (  Return to [facility] 06/23/20 1604)    ED Course  I have reviewed the triage vital signs and the nursing notes.  Pertinent labs & imaging results that were available during my care of the patient were reviewed by me and considered in my medical decision making (see chart for details).    MDM Rules/Calculators/A&P                         Labs sent.  XRays.  Reviewed nursing notes and prior charts for additional history.   Xrays reviewed/interpreted by me - no pna.   Acetaminophen po. Po fluids.   Labs reviewed/interpreted by me - strep neg.  covid test pending.   Carrie Marshall was evaluated in Emergency Department on 06/23/2020 for the symptoms described in the history of present illness. She was evaluated in the context of the global COVID-19 pandemic, which necessitated consideration that the patient might be at risk for infection with the SARS-CoV-2 virus that causes COVID-19. Institutional protocols and algorithms that pertain to the evaluation of patients at risk for COVID-19 are in a state of rapid change based on information released by regulatory bodies including the CDC and federal and state organizations. These policies and algorithms were followed during the patient's care in the ED.  No vomiting in ED. Tolerating po.  Will give covid instructions/preautions while awaiting results.   Pt currently appears stable for d/c.      Final Clinical Impression(s) / ED Diagnoses Final diagnoses:  None    Rx / DC Orders ED Discharge Orders    None       Lajean Saver, MD 06/23/20 1736

## 2020-06-23 NOTE — ED Triage Notes (Addendum)
Since yesterday pt c/o N/V, congestion, cough, sore throat. States tried to eat a cheeseburger for lunch and couldn't keep it down. Able to tolerate apple juice.

## 2020-06-23 NOTE — ED Notes (Signed)
Pt ready to go home. Work note provided. No distress noted.

## 2020-06-24 LAB — SARS CORONAVIRUS 2 (TAT 6-24 HRS): SARS Coronavirus 2: POSITIVE — AB

## 2020-06-25 ENCOUNTER — Other Ambulatory Visit: Payer: Self-pay | Admitting: Family

## 2020-06-25 ENCOUNTER — Other Ambulatory Visit: Payer: Self-pay | Admitting: Physician Assistant

## 2020-06-25 ENCOUNTER — Telehealth: Payer: Self-pay

## 2020-06-25 ENCOUNTER — Ambulatory Visit (HOSPITAL_COMMUNITY)
Admission: RE | Admit: 2020-06-25 | Discharge: 2020-06-25 | Disposition: A | Discharge status: Home / Self Care | Payer: Medicaid Other | Source: Ambulatory Visit | Attending: Pulmonary Disease | Admitting: Pulmonary Disease

## 2020-06-25 ENCOUNTER — Telehealth (HOSPITAL_COMMUNITY): Payer: Self-pay | Admitting: Pharmacist

## 2020-06-25 DIAGNOSIS — J410 Simple chronic bronchitis: Secondary | ICD-10-CM

## 2020-06-25 DIAGNOSIS — Z720 Tobacco use: Secondary | ICD-10-CM

## 2020-06-25 DIAGNOSIS — E663 Overweight: Secondary | ICD-10-CM

## 2020-06-25 DIAGNOSIS — Z79899 Other long term (current) drug therapy: Secondary | ICD-10-CM | POA: Diagnosis not present

## 2020-06-25 DIAGNOSIS — U071 COVID-19: Secondary | ICD-10-CM

## 2020-06-25 LAB — BASIC METABOLIC PANEL
Anion gap: 9 (ref 5–15)
BUN: 14 mg/dL (ref 6–20)
CO2: 23 mmol/L (ref 22–32)
Calcium: 8.8 mg/dL — ABNORMAL LOW (ref 8.9–10.3)
Chloride: 106 mmol/L (ref 98–111)
Creatinine, Ser: 0.62 mg/dL (ref 0.44–1.00)
GFR, Estimated: >60 mL/min (ref >60)
Glucose, Bld: 86 mg/dL (ref 70–99)
Potassium: 3.6 mmol/L (ref 3.5–5.1)
Sodium: 138 mmol/L (ref 135–145)

## 2020-06-25 MED ORDER — NIRMATRELVIR/RITONAVIR (PAXLOVID)TABLET
ORAL_TABLET | ORAL | 0 refills | Status: AC
Start: 2020-06-25 — End: ?

## 2020-06-25 NOTE — Telephone Encounter (Signed)
Patient was prescribed oral covid treatment paxlovid and treatment note was reviewed. Medication has been received by [place] and reviewed for appropriateness.  Drug Interactions or Dosage Adjustments Noted: Patient reports no current medications.  Renal function >60.  No dosage adjustment required  Delivery Method: pickup  Patient contacted for counseling on 06/25/20 and verbalized understanding.   Delivery or Pick-Up Date: 06/25/20   Jaci Carrel 06/25/2020, [facility] Outpatient Pharmacist Phone# [phone]

## 2020-06-25 NOTE — Telephone Encounter (Signed)
Called to discuss with patient about COVID-19 symptoms and the use of one of the available treatments for those with mild to moderate Covid symptoms and at a high risk of hospitalization.  Pt appears to qualify for outpatient treatment due to co-morbid conditions and/or a member of an at-risk group in accordance with the FDA Emergency Use Authorization.    Symptom onset: 06/22/20 Vaccinated: No Booster? No Immunocompromised? No Qualifiers: COPD   Pt. Would like to speak with APP.  Carrie Marshall

## 2020-06-25 NOTE — Progress Notes (Signed)
Called to discuss with patient about COVID-19 symptoms and the use of one of the available treatments for those with mild to moderate Covid symptoms and at a high risk of hospitalization.  Pt appears to qualify for outpatient treatment due to co-morbid conditions and/or a member of an at-risk group in accordance with the FDA Emergency Use Authorization.    Symptom onset: 06/22/20 Vaccinated: No Booster? No Immunocompromised? No Qualifiers: hx of COPD, ongoing tobacco smoking and BMI > 25  Pt is qualified for the monoclonal antibody infusion, but declined due to potential cost. However, she is interested in Paxlovid. She does not take any medications regularly. Currently takes OTC Tylenol and cough medication.  She will come for lab work at [facility] then will need RX based on renal function clearance.   [employer]

## 2020-06-25 NOTE — Progress Notes (Signed)
Outpatient Oral COVID Treatment Note  I connected with Pr[place]a [place] on 06/25/2020/4:07 PM by telephone and verified that I am speaking with the correct person using two identifiers.  I discussed the limitations, risks, security, and privacy concerns of performing an evaluation and management service by telephone and the availability of in person appointments. I also discussed with the patient that there may be a patient responsible charge related to this service. The patient expressed understanding and agreed to proceed.  Patient location: Home Provider location: Clinic  Diagnosis: COVID-19 infection  Purpose of visit: Discussion of potential use of Molnupiravir or Paxlovid, a new treatment for mild to moderate COVID-19 viral infection in non-hospitalized patients.   Subjective: Patient is a 45 y.o. female who has been diagnosed with COVID 19 viral infection.  Their symptoms began on 06/22/20 with cough, sore throat, and congestion.    Past Medical History:  Diagnosis Date  . Anxiety   . Asthma   . Bipolar disorder ([place])   . COPD (chronic obstructive pulmonary disease) ([place])   . Depression   . Dyspnea   . GERD (gastroesophageal reflux disease)   . Headache   . Seizures ([place]) 2008   R/T to drugs    Allergies  Allergen Reactions  . Gabapentin Shortness Of Breath    Chest pain  . Chantix [Varenicline] Nausea And Vomiting and Other (See Comments)    Crazy dreams     Current Outpatient Medications:  .  nirmatrelvir/ritonavir EUA (PAXLOVID) TABS, Take nirmatrelvir (150 mg) 2 tablet(s) twice daily for 5 days and ritonavir (100 mg) one tablet twice daily for 5 days., Disp: 30 tablet, Rfl: 0 .  PROAIR HFA 108 (90 Base) MCG/ACT inhaler, INHALE 2 PUFFS INTO THE LUNGS EVERY 4-6 HOURS AS NEEDED FOR WHEEZING., Disp: , Rfl: 1  Objective: Patient appears/sounds hoarse.  They are in no apparent distress.  Breathing is non labored.  Mood and behavior are normal.  Laboratory Data:  Recent  Results (from the past 2160 hour(s))  Group A Strep by PCR     Status: None   Collection Time: 06/23/20  4:52 PM   Specimen: Throat; Sterile Swab  Result Value Ref Range   Group A Strep by PCR NOT DETECTED NOT DETECTED    Comment: Performed at [facility], [address] [place], [place] [address]  SARS CORONAVIRUS 2 (TAT 6-24 HRS)     Status: Abnormal   Collection Time: 06/23/20  4:52 PM  Result Value Ref Range   SARS Coronavirus 2 POSITIVE (A) NEGATIVE    Comment: (NOTE) SARS-CoV-2 target nucleic acids are DETECTED.  The SARS-CoV-2 RNA is generally detectable in upper and lower respiratory specimens during the acute phase of infection. Positive results are indicative of the presence of SARS-CoV-2 RNA. Clinical correlation with patient history and other diagnostic information is  necessary to determine patient infection status. Positive results do not rule out bacterial infection or co-infection with other viruses.  The expected result is Negative.  Fact Sheet for Patients: SugarRoll.be  Fact Sheet for Healthcare Providers: https://www.woods-mathews.com/  This test is not yet approved or cleared by the [place] FDA and  has been authorized for detection and/or diagnosis of SARS-CoV-2 by FDA under an Emergency Use Authorization (EUA). This EUA will remain  in effect (meaning this test can be used) for the duration of the COVID-19 declaration under Section 564(b)(1) of the Act, 21 U. S.C. section 360bbb-3(b)(1), unless the authorization is terminated or revoked sooner.  Performed at [facility], [place] [address] [place], [place] [address]   Basic metabolic panel     Status: Abnormal   Collection Time: 06/25/20  2:51 PM  Result Value Ref Range   Sodium 138 135 - 145 mmol/L   Potassium 3.6 3.5 - 5.1 mmol/L   Chloride 106 98 - 111 mmol/L   CO2 23 22 - 32 mmol/L   Glucose, Bld 86 70 - 99 mg/dL    Comment:  Glucose reference range applies only to samples taken after fasting for at least 8 hours.   BUN 14 6 - 20 mg/dL   Creatinine, Ser 0.62 0.44 - 1.00 mg/dL   Calcium 8.8 (L) 8.9 - 10.3 mg/dL   GFR, Estimated >60 >60 mL/min    Comment: (NOTE) Calculated using the CKD-EPI Creatinine Equation (2021)    Anion gap 9 5 - 15    Comment: Performed at [facility], [place] [address] [place], [place] [address]     Assessment: 45 y.o. female with mild/moderate COVID 19 viral infection diagnosed on 06/23/20 at high risk for progression to severe COVID 19.  Plan:  This patient is a 45 y.o. female that meets the following criteria for Emergency Use Authorization of: Paxlovid 1. Age >12 yr AND > 40 kg 2. SARS-COV-2 positive test 3. Symptom onset < 5 days 4. Mild-to-moderate COVID disease with high risk for severe progression to hospitalization or death  I have spoken and communicated the following to the patient or parent/caregiver regarding: 1. Paxlovid is an unapproved drug that is authorized for use under an Emergency Use Authorization.  2. There are no adequate, approved, available products for the treatment of COVID-19 in adults who have mild-to-moderate COVID-19 and are at high risk for progressing to severe COVID-19, including hospitalization or death. 3. Other therapeutics are currently authorized. For additional information on all products authorized for treatment or prevention of COVID-19, please see TanEmporium.pl.  4. There are benefits and risks of taking this treatment as outlined in the "Fact Sheet for Patients and Caregivers."  5. "Fact Sheet for Patients and Caregivers" was reviewed with patient. A hard copy will be provided to patient from pharmacy prior to the patient receiving treatment. 6. Patients should continue to self-isolate and use infection control measures  (e.g., wear mask, isolate, social distance, avoid sharing personal items, clean and disinfect "high touch" surfaces, and frequent handwashing) according to CDC guidelines.  7. The patient or parent/caregiver has the option to accept or refuse treatment. 8. Patient medication history was reviewed for potential drug interactions:No drug interactions 9. Patient's GFR was calculated to be 106, and they were therefore prescribed Normal dose (GFR>60) - nirmatrelvir 173m tab (2 tablet) by mouth twice daily AND ritonavir 1069mtab (1 tablet) by mouth twice daily   After reviewing above information with the patient, the patient agrees to receive Paxlovid.  Follow up instructions:    . Take prescription BID x 5 days as directed . Reach out to pharmacist for counseling on medication if desired . For concerns regarding further COVID symptoms please follow up with your PCP or urgent care . For urgent or life-threatening issues, seek care at your local emergency department  The patient was provided an opportunity to ask questions, and all were answered. The patient agreed with the plan and demonstrated an understanding of the instructions.   [facility]  The patient was advised to call  their PCP or seek an in-person evaluation if the symptoms worsen or if the condition fails to improve as anticipated.   I provided 5 minutes of non face-to-face telephone visit time during this encounter, and > 50% was spent counseling as documented under my assessment & plan.  Mauricio Po, FNP 06/25/2020 [place] PM

## 2020-11-27 ENCOUNTER — Other Ambulatory Visit: Payer: Self-pay

## 2021-06-17 ENCOUNTER — Other Ambulatory Visit: Payer: Self-pay

## 2021-06-17 ENCOUNTER — Emergency Department (HOSPITAL_BASED_OUTPATIENT_CLINIC_OR_DEPARTMENT_OTHER)
Admission: EM | Admit: 2021-06-17 | Discharge: 2021-06-17 | Disposition: A | Discharge status: Home / Self Care | Payer: Medicaid Other | Attending: Emergency Medicine | Admitting: Emergency Medicine

## 2021-06-17 ENCOUNTER — Encounter (HOSPITAL_BASED_OUTPATIENT_CLINIC_OR_DEPARTMENT_OTHER): Payer: Self-pay | Admitting: *Deleted

## 2021-06-17 DIAGNOSIS — Z20822 Contact with and (suspected) exposure to covid-19: Secondary | ICD-10-CM | POA: Diagnosis not present

## 2021-06-17 DIAGNOSIS — T7840XA Allergy, unspecified, initial encounter: Secondary | ICD-10-CM | POA: Diagnosis not present

## 2021-06-17 DIAGNOSIS — R21 Rash and other nonspecific skin eruption: Secondary | ICD-10-CM | POA: Diagnosis present

## 2021-06-17 LAB — RESP PANEL BY RT-PCR (FLU A&B, COVID) ARPGX2
Influenza A by PCR: NEGATIVE
Influenza B by PCR: NEGATIVE
SARS Coronavirus 2 by RT PCR: NEGATIVE

## 2021-06-17 MED ORDER — PREDNISONE 20 MG PO TABS: 40.0000 mg | ORAL_TABLET | Freq: Every day | ORAL | 0 refills | Status: AC

## 2021-06-17 MED ORDER — FAMOTIDINE 20 MG PO TABS
20.0000 mg | ORAL_TABLET | Freq: Once | ORAL | Status: AC
Start: 2021-06-17 — End: 2021-06-17
  Administered 2021-06-17: 20 mg via ORAL
  Filled 2021-06-17: qty 1

## 2021-06-17 MED ORDER — PREDNISONE 50 MG PO TABS
60.0000 mg | ORAL_TABLET | Freq: Once | ORAL | Status: AC
  Administered 2021-06-17: 60 mg via ORAL
  Filled 2021-06-17: qty 1

## 2021-06-17 MED ORDER — DIPHENHYDRAMINE HCL 25 MG PO CAPS
50.0000 mg | ORAL_CAPSULE | Freq: Once | ORAL | Status: AC
  Administered 2021-06-17: 50 mg via ORAL
  Filled 2021-06-17: qty 2

## 2021-06-17 MED ORDER — FAMOTIDINE 20 MG PO TABS: 20.0000 mg | ORAL_TABLET | Freq: Two times a day (BID) | ORAL | 0 refills | Status: AC

## 2021-06-17 NOTE — ED Provider Notes (Signed)
[facility] EMERGENCY DEPARTMENT Provider Note   CSN: [id number] Arrival date & time: 06/17/21  1810     History  Chief Complaint  Patient presents with   Rash   Cough    Carrie Marshall is a 46 y.o. female who presents to the ED today with complaint of gradual onset, constant, diffuse itching rash that began 4 days ago.  Patient reports she began experiencing a dry cough 4 days ago.  She picked up over-the-counter nighttime cold and cough medicine from [employer] and began taking it.  She states shortly afterwards she began experiencing an itching rash diffusely.  She states that she has continued to take this medication however today put 2 and 2 together and thinks that her rash is related to the medicine.  She states that she has been taking Benadryl for the itching without relief.  She denies any shortness of breath, wheezing, throat swelling, difficulty swallowing, abdominal pain, nausea, vomiting.  He is unsure if she is taking this medication before.  She is unsure what ingredients are in the medication that she could be responding to.  She does report that she took an at home COVID test which was negative.   The history is provided by the patient and medical records.      Home Medications Prior to Admission medications   Medication Sig Start Date End Date Taking? Authorizing Provider  famotidine (PEPCID) 20 MG tablet Take 1 tablet (20 mg total) by mouth 2 (two) times daily for 5 days. 06/17/21 06/22/21 Yes Mcclellan Demarais, PA-C  predniSONE (DELTASONE) 20 MG tablet Take 2 tablets (40 mg total) by mouth daily for 5 days. 06/17/21 06/22/21 Yes Curtisha Bendix, PA-C  Nirmatrelvir & Ritonavir 20 x 150 MG & 10 x 100MG  TBPK TAKE 3 TABLETS BY MOUTH TWICE A DAY FOR 5 DAYS 06/25/20 06/25/21  08/23/21, FNP  nirmatrelvir/ritonavir EUA (PAXLOVID) TABS Take nirmatrelvir (150 mg) 2 tablet(s) twice daily for 5 days and ritonavir (100 mg) one tablet twice daily for 5 days.  06/25/20   08/23/20, FNP  PROAIR HFA 108 [phone] Base) MCG/ACT inhaler INHALE 2 PUFFS INTO THE LUNGS EVERY 4-6 HOURS AS NEEDED FOR WHEEZING. 11/01/16   [provider]      Allergies    Gabapentin and Chantix [varenicline]    Review of Systems   Review of Systems  Constitutional:  Negative for chills and fever.  Respiratory:  Positive for cough. Negative for shortness of breath and wheezing.   Skin:  Negative for rash.  All other systems reviewed and are negative.  Physical Exam Updated Vital Signs BP 115/81 (BP Location: Right Arm)    Pulse (!) 58    Temp 98.6 F (37 C) (Oral)    Resp 20    Ht 5' 6.5" (1.689 m)    Wt 77.1 kg    LMP 12/18/2016 (Approximate)    SpO2 93%    BMI 27.02 kg/m  Physical Exam Vitals and nursing note reviewed.  Constitutional:      Appearance: She is not ill-appearing or diaphoretic.  HENT:     Head: Normocephalic and atraumatic.     Mouth/Throat:     Mouth: Mucous membranes are moist.     Comments: Uvula midline. No posterior oropharyngeal erythema, edema, exudate. Phonating normally.  Eyes:     Conjunctiva/sclera: Conjunctivae normal.  Cardiovascular:     Rate and Rhythm: Normal rate and regular rhythm.  Pulmonary:     Effort:  Pulmonary effort is normal.     Breath sounds: Normal breath sounds. No wheezing, rhonchi or rales.     Comments: Speaking in full sentences without difficulty. LCTAB.  Skin:    General: Skin is warm and dry.     Coloration: Skin is not jaundiced.     Findings: Rash present.     Comments: Diffuse maculopapular rash with superficial excoriations to trunk and proximal BUE/BLEs.   Neurological:     Mental Status: She is alert.    ED Results / Procedures / Treatments   Labs (all labs ordered are listed, but only abnormal results are displayed) Labs Reviewed  RESP PANEL BY RT-PCR (FLU A&B, COVID) [id number]    EKG None  Radiology No results found.  Procedures Procedures    Medications Ordered in  ED Medications  diphenhydrAMINE (BENADRYL) capsule 50 mg (50 mg Oral Given 06/17/21 1941)  famotidine (PEPCID) tablet 20 mg (20 mg Oral Given 06/17/21 1941)  predniSONE (DELTASONE) tablet 60 mg (60 mg Oral Given 06/17/21 1941)    ED Course/ Medical Decision Making/ A&P                           Medical Decision Making 46 year old female who presents to the ED today with complaint of rash x4 days after starting over-the-counter medication for cold and cough.  Has had a cough for 4 days.  Continue to take the same medication for 4 days.  On arrival to the ED vitals are stable.  Patient appears to be in no acute distress.  She is noted to have diffuse maculopapular rash with superficial excoriations.  She has no other symptoms besides rash including throat swelling, difficulty swallowing, wheezing, shortness of breath, abdominal pain, nausea, vomiting.  We will treat with Benadryl, Pepcid, prednisone.  Patient instructed that she should stop taking the over-the-counter medication as this is likely the culprit.  Given new onset cough for the past 4 days we will also swab for COVID and flu.  His lungs are clear to auscultation bilaterally.  Low suspicion for pneumonia.  Problems Addressed: Allergic reaction, initial encounter: acute illness or injury  Amount and/or Complexity of Data Reviewed Labs: ordered.    Details: COVID and flu negative          Final Clinical Impression(s) / ED Diagnoses Final diagnoses:  Allergic reaction, initial encounter    Rx / DC Orders ED Discharge Orders          Ordered    predniSONE (DELTASONE) 20 MG tablet  Daily        06/17/21 2118    famotidine (PEPCID) 20 MG tablet  2 times daily        06/17/21 2118             Discharge Instructions      Please discontinue to OTC medication you have been taking that is likely the cause of your reaction.   Pick up Prednisone and Pepcid and take as prescribed. Continue taking the Benadryl daily.    Follow up with [facility] for primary care needs  Return to the ED for any new/worsening symptoms       Tanda Rockers, Cordelia Poche 06/17/21 2119    Derwood Kaplan, MD 06/19/21 1422

## 2021-06-17 NOTE — Discharge Instructions (Addendum)
Please discontinue to OTC medication you have been taking that is likely the cause of your reaction.   Pick up Prednisone and Pepcid and take as prescribed. Continue taking the Benadryl daily.   Follow up with [facility] for primary care needs  Return to the ED for any new/worsening symptoms

## 2021-06-17 NOTE — ED Triage Notes (Signed)
Cough x 3 days. She is here for a rash that started after starting OTC cough medication.

## 2021-10-19 ENCOUNTER — Emergency Department (HOSPITAL_BASED_OUTPATIENT_CLINIC_OR_DEPARTMENT_OTHER): Payer: Medicaid Other

## 2021-10-19 ENCOUNTER — Encounter (HOSPITAL_BASED_OUTPATIENT_CLINIC_OR_DEPARTMENT_OTHER): Payer: Self-pay | Admitting: Emergency Medicine

## 2021-10-19 ENCOUNTER — Emergency Department (HOSPITAL_BASED_OUTPATIENT_CLINIC_OR_DEPARTMENT_OTHER)
Admission: EM | Admit: 2021-10-19 | Discharge: 2021-10-19 | Disposition: A | Discharge status: Home / Self Care | Payer: Medicaid Other | Attending: Emergency Medicine | Admitting: Emergency Medicine

## 2021-10-19 ENCOUNTER — Other Ambulatory Visit: Payer: Self-pay

## 2021-10-19 DIAGNOSIS — J449 Chronic obstructive pulmonary disease, unspecified: Secondary | ICD-10-CM | POA: Diagnosis not present

## 2021-10-19 DIAGNOSIS — M659 Synovitis and tenosynovitis, unspecified: Secondary | ICD-10-CM | POA: Diagnosis not present

## 2021-10-19 DIAGNOSIS — M7989 Other specified soft tissue disorders: Secondary | ICD-10-CM | POA: Diagnosis present

## 2021-10-19 MED ORDER — MELOXICAM 7.5 MG PO TABS: 7.5000 mg | ORAL_TABLET | Freq: Every day | ORAL | 0 refills | Status: AC

## 2021-10-19 NOTE — ED Triage Notes (Signed)
Left hand swelling and pain since yesterday. States pain is now going down into left wrist. No relief with otc meds. Denies trauma. Limited ROM in hand, specifically the thumb.

## 2021-10-19 NOTE — ED Provider Notes (Signed)
[facility] Provider Note   CSN: [id number] Arrival date & time: 10/19/21  1502     History  Chief Complaint  Patient presents with   Hand Pain   Carrie Marshall is a 46 y.o. female with COPD, GERD who presents for left hand swelling.  Patient works at a [employer] and was working on Sunday when she agreed to help stop the back room, carrying heavy items. She woke up Monday morning with pain and swelling of the left wrist and thumb. She worked yesterday and was having significant pain using the left hand. Her friend gave her a splint for carpal tunnel but this was uncomfortable for her. She has been icing and wrapping the area which helps. She has taken tylenol and aspirin but her pain and swelling has persisted today which promoted her to present to the ED. She denies fevers, chills, night sweats. She denies other swollen joints. Has never had red hot swollen joints before. She denies rash, vision changes or eye pain.     Home Medications Prior to Admission medications   Medication Sig Start Date End Date Taking? Authorizing Provider  meloxicam (MOBIC) 7.5 MG tablet Take 1 tablet (7.5 mg total) by mouth daily for 7 days. 10/19/21 10/26/21 Yes Wayland Denis, MD  famotidine (PEPCID) 20 MG tablet Take 1 tablet (20 mg total) by mouth 2 (two) times daily for 5 days. 06/17/21 06/22/21  Eustaquio Maize, PA-C  nirmatrelvir/ritonavir EUA (PAXLOVID) TABS Take nirmatrelvir (150 mg) 2 tablet(s) twice daily for 5 days and ritonavir (100 mg) one tablet twice daily for 5 days. 06/25/20   Golden Circle, FNP  PROAIR HFA 108 [phone] Base) MCG/ACT inhaler INHALE 2 PUFFS INTO THE LUNGS EVERY 4-6 HOURS AS NEEDED FOR WHEEZING. 11/01/16   [provider]      Allergies    Gabapentin and Chantix [varenicline]    Review of Systems   Review of Systems  Constitutional:  Negative for chills and fever.  Respiratory:  Negative for shortness of breath.   Cardiovascular:   Negative for chest pain.  Gastrointestinal:  Negative for abdominal pain.  Musculoskeletal:  Positive for joint swelling.       L wrist pain, left thumb pain and stiffness  Skin:  Negative for rash.   Physical Exam Updated Vital Signs BP 119/83 (BP Location: Left Arm)   Pulse (!) 55   Temp 98.4 F (36.9 C) (Oral)   Resp 18   LMP 12/18/2016 (Approximate)   SpO2 97%  Physical Exam Vitals reviewed.  Constitutional:      General: She is not in acute distress.    Appearance: Normal appearance. She is not toxic-appearing.  HENT:     Head: Normocephalic and atraumatic.  Cardiovascular:     Rate and Rhythm: Normal rate and regular rhythm.     Heart sounds: No murmur heard. Pulmonary:     Effort: Pulmonary effort is normal.     Breath sounds: Normal breath sounds.  Abdominal:     General: Abdomen is flat. There is no distension.  Musculoskeletal:     Comments: Limited ROM of left thumb secondary to pain. Mild left wrist and proximal thumb swelling. No significant erythema. No increase in calor. TTP of left radial side of wrist and proximal thumb joint.   Skin:    General: Skin is warm and dry.  Neurological:     Mental Status: She is alert and oriented to person, place, and time. Mental status  is at baseline.  Psychiatric:        Mood and Affect: Mood normal.        Behavior: Behavior normal.    ED Results / Procedures / Treatments   Labs (all labs ordered are listed, but only abnormal results are displayed) Labs Reviewed - No data to display  EKG None  Radiology DG Wrist Complete Left  Result Date: 10/19/2021 CLINICAL DATA:  Left hand and wrist pain with swelling. Decreased range of motion, particularly in the thumb. EXAM: LEFT WRIST - COMPLETE 3+ VIEW COMPARISON:  None Available. FINDINGS: No fracture, dislocation, or destructive osseous process is identified. Joint space widths are preserved. There is likely mild soft tissue swelling about the wrist. No radiopaque  foreign body or subcutaneous emphysema is evident. IMPRESSION: No acute osseous abnormality identified. Electronically Signed   By: Logan Bores M.D.   On: 10/19/2021 15:35   DG Hand Complete Left  Result Date: 10/19/2021 CLINICAL DATA:  Left hand and wrist pain with swelling. Decreased range of motion, particularly in the thumb. EXAM: LEFT HAND - COMPLETE 3+ VIEW COMPARISON:  None Available. FINDINGS: There is no evidence of fracture or dislocation. There is no evidence of arthropathy or other focal bone abnormality. Soft tissues are unremarkable. IMPRESSION: Negative. Electronically Signed   By: Logan Bores M.D.   On: 10/19/2021 15:33      Medications Ordered in ED None  ED Course/ Medical Decision Making/ A&P                           Medical Decision Making Amount and/or Complexity of Data Reviewed Radiology: ordered.  Risk Prescription drug management.  [name] is a 46 y.o. female with COPD, GERD who presents for left hand swelling. XR of left wrist and hand obtained and negative for acute osseous injury with mild soft tissue swelling around the wrist. Suspect tenosynovitis, possibly De Quervain's tenosynovitis,  from overuse injury lifting heavy items. Also considered septic joint though patient afebrile, no subjective fevers, chills, no significant erythema or increase in calor. Will treat conservatively with RICE. Will provide spica splint. Prescribed Meloxicam for max 7 day course, discussed taking medication after a meal to protect lining of the stomach and to avoid other NSAIDs while taking this medication. Also encouraged to take tylenol for pain as needed for additional pain relief. Will provide educational handout for tenosynovitis as well as contact information for orthopedics if needed. Discussed ED return precautions and patient is in agreement with this plan. She is medically safe for discharge.   Final Clinical Impression(s) / ED Diagnoses Final diagnoses:   Tenosynovitis of thumb    Rx / DC Orders ED Discharge Orders          Ordered    meloxicam (MOBIC) 7.5 MG tablet  Daily        10/19/21 1720              Wayland Denis, MD 10/19/21 2346    Margette Fast, MD 10/20/21 1253

## 2021-10-19 NOTE — Discharge Instructions (Addendum)
You were evaluated at [facility] ED for inflamed tendon/overuse injury.  Your x-rays did not show any injury to the bone, they did show some mild swelling over the wrist.   Please avoid activities that worsen your pain.  It will be important to let your hand rest as the tendon heals. Wrapping the hand can also help, we provided you with a spica splint before you left or you can use your wrap, whichever feels better. Please also elevate and ice the hand.   You can take Meloxicam once a day if needed for total of 7 days. You can also take tylenol for pain, no more than 1,049m tylenol three times a day. Please avoid taking NSAIDs such as aspirin, ibuprofen, motrin, Advil while taking Meloxicam.   If you start to have worsening pain, redness, fevers please report back to the emergency room for reevaluation.   Thank you for allowing [facility] be part of your care.

## 2023-02-04 ENCOUNTER — Emergency Department (HOSPITAL_BASED_OUTPATIENT_CLINIC_OR_DEPARTMENT_OTHER)
Admission: EM | Admit: 2023-02-04 | Discharge: 2023-02-04 | Disposition: A | Discharge status: Home / Self Care | Payer: Medicaid Other | Attending: Emergency Medicine | Admitting: Emergency Medicine

## 2023-02-04 ENCOUNTER — Other Ambulatory Visit: Payer: Self-pay

## 2023-02-04 ENCOUNTER — Encounter (HOSPITAL_BASED_OUTPATIENT_CLINIC_OR_DEPARTMENT_OTHER): Payer: Self-pay | Admitting: Emergency Medicine

## 2023-02-04 DIAGNOSIS — R110 Nausea: Secondary | ICD-10-CM | POA: Diagnosis present

## 2023-02-04 DIAGNOSIS — B349 Viral infection, unspecified: Secondary | ICD-10-CM | POA: Insufficient documentation

## 2023-02-04 DIAGNOSIS — Z1152 Encounter for screening for COVID-19: Secondary | ICD-10-CM | POA: Diagnosis not present

## 2023-02-04 LAB — SARS CORONAVIRUS 2 BY RT PCR: SARS Coronavirus 2 by RT PCR: NEGATIVE

## 2023-02-04 MED ORDER — ALUM & MAG HYDROXIDE-SIMETH 200-200-20 MG/5ML PO SUSP
30.0000 mL | Freq: Once | ORAL | Status: AC
  Administered 2023-02-04: 30 mL via ORAL
  Filled 2023-02-04: qty 30

## 2023-02-04 MED ORDER — ONDANSETRON 4 MG PO TBDP
4.0000 mg | ORAL_TABLET | Freq: Once | ORAL | Status: AC
Start: 2023-02-04 — End: 2023-02-04
  Administered 2023-02-04: 4 mg via ORAL
  Filled 2023-02-04: qty 1

## 2023-02-04 MED ORDER — ONDANSETRON HCL 4 MG PO TABS: 4.0000 mg | ORAL_TABLET | Freq: Four times a day (QID) | ORAL | 0 refills | Status: AC

## 2023-02-04 MED ORDER — FAMOTIDINE 20 MG PO TABS
20.0000 mg | ORAL_TABLET | Freq: Once | ORAL | Status: AC
Start: 2023-02-04 — End: 2023-02-04
  Administered 2023-02-04: 20 mg via ORAL
  Filled 2023-02-04: qty 1

## 2023-02-04 NOTE — ED Triage Notes (Signed)
Chills and sweats with nausea since Thursday.

## 2023-02-04 NOTE — ED Provider Notes (Signed)
[place] EMERGENCY DEPARTMENT AT [facility] Provider Note   CSN: [id number] Arrival date & time: 02/04/23  0813     History  Chief Complaint  Patient presents with   Chills    Carrie Marshall is a 47 y.o. female.  Patient is a 47 year old female with no significant past medical history presenting to the emergency department with nausea and bodyaches.  The patient states that she has been feeling sick since Thursday.  She states that she did have a coughing spell on Thursday that she needed her inhaler for but has not felt short of breath since then.  She states that she has had a mild nonproductive cough.  She states that she has been having hot and cold flashes but Tmax at home was 100 degrees.  She states that she has been nauseous and vomited once on Thursday.  She denies any associated abdominal pain.  She does report some heartburn.  She denies any diarrhea.  She denies any known sick contacts.  Of note she states that she has had a hysterectomy and has not seen a doctor since then.  The history is provided by the patient.       Home Medications Prior to Admission medications   Medication Sig Start Date End Date Taking? Authorizing Provider  ondansetron (ZOFRAN) 4 MG tablet Take 1 tablet (4 mg total) by mouth every 6 (six) hours. 02/04/23  Yes Theresia Lo, Benetta Spar K, DO  famotidine (PEPCID) 20 MG tablet Take 1 tablet (20 mg total) by mouth 2 (two) times daily for 5 days. 06/17/21 06/22/21  Tanda Rockers, PA-C  nirmatrelvir/ritonavir EUA (PAXLOVID) TABS Take nirmatrelvir (150 mg) 2 tablet(s) twice daily for 5 days and ritonavir (100 mg) one tablet twice daily for 5 days. 06/25/20   Veryl Speak, FNP  PROAIR HFA 108 [phone] Base) MCG/ACT inhaler INHALE 2 PUFFS INTO THE LUNGS EVERY 4-6 HOURS AS NEEDED FOR WHEEZING. 11/01/16   [provider]      Allergies    Gabapentin and Chantix [varenicline]    Review of Systems   Review of Systems  Physical  Exam Updated Vital Signs BP 114/67 (BP Location: Right Arm)   Pulse (!) 55   Temp 98.1 F (36.7 C) (Oral)   Resp 18   Ht 5' 6.5" (1.689 m)   Wt 70.3 kg   LMP 12/18/2016 (Approximate) Comment: upreg neg  SpO2 100%   BMI 24.64 kg/m  Physical Exam Vitals and nursing note reviewed.  Constitutional:      General: She is not in acute distress.    Appearance: Normal appearance.  HENT:     Head: Normocephalic and atraumatic.     Nose: Nose normal.     Mouth/Throat:     Mouth: Mucous membranes are moist.     Pharynx: Oropharynx is clear.  Eyes:     Extraocular Movements: Extraocular movements intact.     Conjunctiva/sclera: Conjunctivae normal.  Cardiovascular:     Rate and Rhythm: Normal rate and regular rhythm.     Heart sounds: Normal heart sounds.  Pulmonary:     Effort: Pulmonary effort is normal.     Breath sounds: Normal breath sounds.  Abdominal:     General: Abdomen is flat.     Palpations: Abdomen is soft.     Tenderness: There is no abdominal tenderness.  Musculoskeletal:        General: Normal range of motion.     Cervical back: Normal range of motion  and neck supple.  Lymphadenopathy:     Cervical: No cervical adenopathy.  Skin:    General: Skin is warm and dry.  Neurological:     General: No focal deficit present.     Mental Status: She is alert and oriented to person, place, and time.  Psychiatric:        Mood and Affect: Mood normal.        Behavior: Behavior normal.     ED Results / Procedures / Treatments   Labs (all labs ordered are listed, but only abnormal results are displayed) Labs Reviewed  SARS CORONAVIRUS 2 BY RT PCR    EKG None  Radiology No results found.  Procedures Procedures    Medications Ordered in ED Medications  ondansetron (ZOFRAN-ODT) disintegrating tablet 4 mg (has no administration in time range)  famotidine (PEPCID) tablet 20 mg (has no administration in time range)  alum & mag hydroxide-simeth (MAALOX/MYLANTA)  200-200-20 MG/5ML suspension 30 mL (has no administration in time range)    ED Course/ Medical Decision Making/ A&P                                 Medical Decision Making This patient presents to the ED with chief complaint(s) of chills, nausea with pertinent past medical history of hysterectomy which further complicates the presenting complaint. The complaint involves an extensive differential diagnosis and also carries with it a high risk of complications and morbidity.    The differential diagnosis includes viral syndrome, patient has moist mucous membranes and normal vital signs with no signs of dehydration, no focal lung sounds and satting well on room air making pneumonia unlikely, she has had a hysterectomy making pregnancy unlikely  Additional history obtained: Additional history obtained from spouse Records reviewed N/A  ED Course and Reassessment: On patient's arrival she is hemodynamically stable in no acute distress.  She was initially evaluated by triage had COVID swab which was negative.  Patient likely has another viral syndrome and was recommended continued symptomatic management.  She was given Zofran and GI cocktail and is stable for discharge home with outpatient follow-up.  She was given strict return precautions.  Independent labs interpretation:  The following labs were independently interpreted: COVID-negative  Independent visualization of imaging: -N/A  Consultation: - Consulted or discussed management/test interpretation w/ external professional: N/A  Consideration for admission or further workup: Patient has no emergent conditions requiring admission or further work-up at this time and is stable for discharge home with primary care follow-up  Social Determinants of health: N/A    Risk OTC drugs. Prescription drug management.          Final Clinical Impression(s) / ED Diagnoses Final diagnoses:  Nausea  Viral syndrome    Rx / DC Orders ED  Discharge Orders          Ordered    ondansetron (ZOFRAN) 4 MG tablet  Every 6 hours        02/04/23 0947              Rexford Maus, DO 02/04/23 [phone]

## 2023-02-04 NOTE — Discharge Instructions (Signed)
You were seen in the emergency department for your nausea and chills.  You likely have a viral infection though you tested negative for COVID-19.  You can continued symptomatic treatment with Tylenol or Motrin for body aches, over-the-counter antacids such as Pepcid or Maalox for heartburn and I have given you prescription of Zofran as needed for nausea.  You can follow-up with your primary doctor in the next few days to have your symptoms rechecked.  You should return to the emergency department for significantly worsening shortness of breath, repetitive vomiting despite the nausea medicine or if you have any other new or concerning symptoms.

## 2023-03-16 ENCOUNTER — Encounter (HOSPITAL_BASED_OUTPATIENT_CLINIC_OR_DEPARTMENT_OTHER): Payer: Self-pay | Admitting: Emergency Medicine

## 2023-03-16 ENCOUNTER — Other Ambulatory Visit: Payer: Self-pay

## 2023-03-16 ENCOUNTER — Emergency Department (HOSPITAL_BASED_OUTPATIENT_CLINIC_OR_DEPARTMENT_OTHER)
Admission: EM | Admit: 2023-03-16 | Discharge: 2023-03-16 | Disposition: A | Discharge status: Home / Self Care | Payer: Medicaid Other | Attending: Emergency Medicine | Admitting: Emergency Medicine

## 2023-03-16 ENCOUNTER — Emergency Department (HOSPITAL_BASED_OUTPATIENT_CLINIC_OR_DEPARTMENT_OTHER): Payer: Medicaid Other

## 2023-03-16 DIAGNOSIS — J449 Chronic obstructive pulmonary disease, unspecified: Secondary | ICD-10-CM | POA: Diagnosis not present

## 2023-03-16 DIAGNOSIS — Z20822 Contact with and (suspected) exposure to covid-19: Secondary | ICD-10-CM | POA: Diagnosis not present

## 2023-03-16 DIAGNOSIS — Z7951 Long term (current) use of inhaled steroids: Secondary | ICD-10-CM | POA: Diagnosis not present

## 2023-03-16 DIAGNOSIS — R059 Cough, unspecified: Secondary | ICD-10-CM | POA: Diagnosis present

## 2023-03-16 DIAGNOSIS — J069 Acute upper respiratory infection, unspecified: Secondary | ICD-10-CM | POA: Diagnosis not present

## 2023-03-16 LAB — RESP PANEL BY RT-PCR (RSV, FLU A&B, COVID)  RVPGX2
Influenza A by PCR: NEGATIVE
Influenza B by PCR: NEGATIVE
Resp Syncytial Virus by PCR: NEGATIVE
SARS Coronavirus 2 by RT PCR: NEGATIVE

## 2023-03-16 LAB — CBC
HCT: 41 % (ref 36.0–46.0)
Hemoglobin: 13.5 g/dL (ref 12.0–15.0)
MCH: 26.8 pg (ref 26.0–34.0)
MCHC: 32.9 g/dL (ref 30.0–36.0)
MCV: 81.5 fL (ref 80.0–100.0)
Platelets: 215 10*3/uL (ref 150–400)
RBC: 5.03 MIL/uL (ref 3.87–5.11)
RDW: 13.4 % (ref 11.5–15.5)
WBC: 12.8 10*3/uL — ABNORMAL HIGH (ref 4.0–10.5)
nRBC: 0 % (ref 0.0–0.2)

## 2023-03-16 LAB — COMPREHENSIVE METABOLIC PANEL
ALT: 16 U/L (ref 0–44)
AST: 17 U/L (ref 15–41)
Albumin: 3.8 g/dL (ref 3.5–5.0)
Alkaline Phosphatase: 96 U/L (ref 38–126)
Anion gap: 9 (ref 5–15)
BUN: 18 mg/dL (ref 6–20)
CO2: 21 mmol/L — ABNORMAL LOW (ref 22–32)
Calcium: 8.5 mg/dL — ABNORMAL LOW (ref 8.9–10.3)
Chloride: 103 mmol/L (ref 98–111)
Creatinine, Ser: 0.66 mg/dL (ref 0.44–1.00)
GFR, Estimated: >60 mL/min (ref >60)
Glucose, Bld: 118 mg/dL — ABNORMAL HIGH (ref 70–99)
Potassium: 3.4 mmol/L — ABNORMAL LOW (ref 3.5–5.1)
Sodium: 133 mmol/L — ABNORMAL LOW (ref 135–145)
Total Bilirubin: 0.6 mg/dL (ref 0.3–1.2)
Total Protein: 7.1 g/dL (ref 6.5–8.1)

## 2023-03-16 MED ORDER — IBUPROFEN 400 MG PO TABS
600.0000 mg | ORAL_TABLET | Freq: Once | ORAL | Status: AC
  Administered 2023-03-16: 600 mg via ORAL
  Filled 2023-03-16: qty 1

## 2023-03-16 MED ORDER — ONDANSETRON HCL 4 MG/2ML IJ SOLN
4.0000 mg | Freq: Once | INTRAMUSCULAR | Status: AC
  Administered 2023-03-16: 4 mg via INTRAVENOUS
  Filled 2023-03-16: qty 2

## 2023-03-16 MED ORDER — POTASSIUM CHLORIDE CRYS ER 20 MEQ PO TBCR
20.0000 meq | EXTENDED_RELEASE_TABLET | Freq: Once | ORAL | Status: AC
  Administered 2023-03-16: 20 meq via ORAL
  Filled 2023-03-16: qty 1

## 2023-03-16 MED ORDER — ACETAMINOPHEN 500 MG PO TABS
1000.0000 mg | ORAL_TABLET | Freq: Once | ORAL | Status: AC
  Administered 2023-03-16: 1000 mg via ORAL
  Filled 2023-03-16: qty 2

## 2023-03-16 MED ORDER — ONDANSETRON 4 MG PO TBDP: 4.0000 mg | ORAL_TABLET | Freq: Once | ORAL | Status: DC

## 2023-03-16 MED ORDER — ONDANSETRON 4 MG PO TBDP: 4.0000 mg | ORAL_TABLET | Freq: Three times a day (TID) | ORAL | 0 refills | Status: AC | PRN

## 2023-03-16 NOTE — ED Provider Notes (Signed)
[place] [facility] Provider Note   CSN: [id number] Arrival date & time: 03/16/23  0865     History  Chief Complaint  Patient presents with   URI    Carrie Marshall is a 47 y.o. female.   URI 47 year old female history of COPD, depression, GERD presenting for nausea, vomiting, cough, body aches.  She states since yesterday she has had cough, congestion, body aches, chills.  Mild sore throat as well.  She has had multiple emesis nonbloody since 3 AM.  Seems loose stool this morning without blood or melena.  No chest pain or abdominal pain.  No shortness of breath.  No urinary symptoms.     Home Medications Prior to Admission medications   Medication Sig Start Date End Date Taking? Authorizing Provider  ondansetron (ZOFRAN-ODT) 4 MG disintegrating tablet Take 1 tablet (4 mg total) by mouth every 8 (eight) hours as needed for nausea or vomiting. 03/16/23  Yes Laurence Spates, MD  famotidine (PEPCID) 20 MG tablet Take 1 tablet (20 mg total) by mouth 2 (two) times daily for 5 days. 06/17/21 06/22/21  Tanda Rockers, PA-C  nirmatrelvir/ritonavir EUA (PAXLOVID) TABS Take nirmatrelvir (150 mg) 2 tablet(s) twice daily for 5 days and ritonavir (100 mg) one tablet twice daily for 5 days. 06/25/20   Veryl Speak, FNP  ondansetron (ZOFRAN) 4 MG tablet Take 1 tablet (4 mg total) by mouth every 6 (six) hours. 02/04/23   Rexford Maus, DO  PROAIR HFA 108 [phone] Base) MCG/ACT inhaler INHALE 2 PUFFS INTO THE LUNGS EVERY 4-6 HOURS AS NEEDED FOR WHEEZING. 11/01/16   [provider]      Allergies    Gabapentin and Chantix [varenicline]    Review of Systems   Review of Systems Review of systems completed and notable as per HPI.  ROS otherwise negative.   Physical Exam Updated Vital Signs BP 109/69   Pulse 64   Temp 98.3 F (36.8 C)   Resp (!) 22   Wt 70.3 kg   LMP 12/18/2016 (Approximate) Comment: upreg neg  SpO2 99%   BMI  24.64 kg/m  Physical Exam Vitals and nursing note reviewed.  Constitutional:      General: She is not in acute distress.    Appearance: She is well-developed.  HENT:     Head: Normocephalic and atraumatic.     Nose: Nose normal.     Mouth/Throat:     Mouth: Mucous membranes are moist.     Pharynx: Oropharynx is clear.  Eyes:     Extraocular Movements: Extraocular movements intact.     Conjunctiva/sclera: Conjunctivae normal.     Pupils: Pupils are equal, round, and reactive to light.  Cardiovascular:     Rate and Rhythm: Normal rate and regular rhythm.     Pulses: Normal pulses.     Heart sounds: Normal heart sounds. No murmur heard. Pulmonary:     Effort: Pulmonary effort is normal. No respiratory distress.     Breath sounds: Normal breath sounds.  Abdominal:     Palpations: Abdomen is soft.     Tenderness: There is no abdominal tenderness. There is no guarding or rebound.  Musculoskeletal:        General: No swelling.     Cervical back: Normal range of motion and neck supple. No rigidity.     Right lower leg: No edema.     Left lower leg: No edema.  Skin:    General:  Skin is warm and dry.     Capillary Refill: Capillary refill takes less than 2 seconds.  Neurological:     Mental Status: She is alert.  Psychiatric:        Mood and Affect: Mood normal.     ED Results / Procedures / Treatments   Labs (all labs ordered are listed, but only abnormal results are displayed) Labs Reviewed  CBC - Abnormal; Notable for the following components:      Result Value   WBC 12.8 (*)    All other components within normal limits  COMPREHENSIVE METABOLIC PANEL - Abnormal; Notable for the following components:   Sodium 133 (*)    Potassium 3.4 (*)    CO2 21 (*)    Glucose, Bld 118 (*)    Calcium 8.5 (*)    All other components within normal limits  RESP PANEL BY RT-PCR (RSV, FLU A&B, COVID)  [id number]    EKG None  Radiology DG Chest 2 View  Result Date:  03/16/2023 CLINICAL DATA:  Cough. EXAM: CHEST - 2 VIEW COMPARISON:  06/23/2020. FINDINGS: Bilateral lung fields are clear. Bilateral costophrenic angles are clear. Normal cardio-mediastinal silhouette. No acute osseous abnormalities. The soft tissues are within normal limits. IMPRESSION: *No active cardiopulmonary disease. Electronically Signed   By: Jules Schick M.D.   On: 03/16/2023 10:34    Procedures Procedures    Medications Ordered in ED Medications  ibuprofen (ADVIL) tablet 600 mg (600 mg Oral Given 03/16/23 0914)  acetaminophen (TYLENOL) tablet 1,000 mg (1,000 mg Oral Given 03/16/23 0914)  ondansetron (ZOFRAN) injection 4 mg (4 mg Intravenous Given 03/16/23 0915)  potassium chloride SA (KLOR-CON M) CR tablet 20 mEq (20 mEq Oral Given 03/16/23 1023)    ED Course/ Medical Decision Making/ A&P                                 Medical Decision Making Amount and/or Complexity of Data Reviewed Labs: ordered. Radiology: ordered.  Risk OTC drugs. Prescription drug management.   Medical Decision Making:   Carrie Marshall is a 47 y.o. female who presented to the ED today with vomiting, URI symptoms.  Vital signs reviewed.  Exam she is well-appearing.  Afebrile.  Symptoms seem more consistent likely viral syndrome.  She has a multiple is with emesis and some loose stool will obtain some blood work to evaluate electrolytes.  Will treat symptomatically as well.  No clinical signs of systemic infection or severe dehydration.  Benign abdominal exam, low suspicion for obstruction or other acute intra-abdominal pathology.   Patient placed on continuous vitals and telemetry monitoring while in ED which was reviewed periodically.  Reviewed and confirmed nursing documentation for past medical history, family history, social history.  Reassessment and Plan:   On reassessment she is feeling better.  Tolerant p.o. her nausea improved.  Lab normal for mild leukocytosis, mild hypokalemia  and hyponatremia.  Repleted.  I suspect she has a mild dehydration but is tolerant p.o. without difficulty.  No additional vomiting here.  Overall presentation most consistent likely viral syndrome.  I reviewed her x-ray, I did not see any obvious pneumonia, pneumothorax or other acute abnormality.  Patient states she would like to leave and is not able to stay for the chest x-ray read.  I did discuss with her that we could be missing serious emergent pathology.  She is not able to wait and needs to  go home.  I recommend she follow close with her PCP.  Strict turn precautions given.   Patient's presentation is most consistent with acute complicated illness / injury requiring diagnostic workup.           Final Clinical Impression(s) / ED Diagnoses Final diagnoses:  Upper respiratory tract infection, unspecified type    Rx / DC Orders ED Discharge Orders          Ordered    ondansetron (ZOFRAN-ODT) 4 MG disintegrating tablet  Every 8 hours PRN        03/16/23 1038              Laurence Spates, MD 03/16/23 1038

## 2023-03-16 NOTE — Discharge Instructions (Addendum)
Your symptoms are most likely due to a viral infection.  Your chest x-ray was not read but you needed to leave the ED prior to this being finished.  If you develop difficulty breathing, chest pain, persistent vomiting or any other new concerning symptoms you should return to the ED.

## 2023-03-16 NOTE — ED Triage Notes (Signed)
Her symptoms started yesterday with cough , at 3 am woke up vomiting , chills and body aches . Denies abd pain , diarrhea x 2 this morning .
